# Patient Record
Sex: Female | Born: 1985 | Hispanic: No | Marital: Single | Smoking: Never smoker
Health system: Southern US, Community
[De-identification: ages and names within clinical notes are randomized; demographics above are authoritative.]

## PROBLEM LIST (undated history)

## (undated) DIAGNOSIS — Z8619 Personal history of other infectious and parasitic diseases: Secondary | ICD-10-CM

## (undated) DIAGNOSIS — I10 Essential (primary) hypertension: Secondary | ICD-10-CM

## (undated) DIAGNOSIS — F419 Anxiety disorder, unspecified: Secondary | ICD-10-CM

## (undated) DIAGNOSIS — R87629 Unspecified abnormal cytological findings in specimens from vagina: Secondary | ICD-10-CM

## (undated) DIAGNOSIS — J302 Other seasonal allergic rhinitis: Secondary | ICD-10-CM

## (undated) HISTORY — DX: Personal history of other infectious and parasitic diseases: Z86.19

## (undated) HISTORY — DX: Other seasonal allergic rhinitis: J30.2

## (undated) HISTORY — PX: FOOT SURGERY: SHX648

## (undated) HISTORY — DX: Unspecified abnormal cytological findings in specimens from vagina: R87.629

## (undated) HISTORY — DX: Anxiety disorder, unspecified: F41.9

## (undated) HISTORY — PX: COLPOSCOPY: SHX161

## (undated) HISTORY — PX: OTHER SURGICAL HISTORY: SHX169

---
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
## 2006-06-15 ENCOUNTER — Other Ambulatory Visit: Admission: RE | Admit: 2006-06-15 | Discharge: 2006-06-15 | Payer: Self-pay | Admitting: Obstetrics and Gynecology

## 2006-06-19 ENCOUNTER — Emergency Department (HOSPITAL_COMMUNITY): Admission: EM | Admit: 2006-06-19 | Discharge: 2006-06-19 | Payer: Self-pay | Admitting: Family Medicine

## 2006-12-02 ENCOUNTER — Emergency Department (HOSPITAL_COMMUNITY): Admission: EM | Admit: 2006-12-02 | Discharge: 2006-12-02 | Payer: Self-pay | Admitting: Emergency Medicine

## 2007-09-20 ENCOUNTER — Emergency Department (HOSPITAL_COMMUNITY): Admission: EM | Admit: 2007-09-20 | Discharge: 2007-09-20 | Payer: Self-pay | Admitting: Emergency Medicine

## 2010-02-21 ENCOUNTER — Inpatient Hospital Stay (HOSPITAL_COMMUNITY): Admission: AD | Admit: 2010-02-21 | Discharge: 2010-02-21 | Payer: Self-pay | Admitting: Obstetrics and Gynecology

## 2010-03-13 ENCOUNTER — Inpatient Hospital Stay (HOSPITAL_COMMUNITY): Admission: AD | Admit: 2010-03-13 | Discharge: 2010-03-13 | Payer: Self-pay | Admitting: Obstetrics and Gynecology

## 2010-03-18 ENCOUNTER — Inpatient Hospital Stay (HOSPITAL_COMMUNITY): Admission: AD | Admit: 2010-03-18 | Discharge: 2010-03-18 | Payer: Self-pay | Admitting: Obstetrics and Gynecology

## 2010-03-18 ENCOUNTER — Ambulatory Visit: Payer: Self-pay | Admitting: Obstetrics and Gynecology

## 2010-03-29 ENCOUNTER — Ambulatory Visit: Payer: Self-pay | Admitting: Gynecology

## 2010-03-29 ENCOUNTER — Inpatient Hospital Stay (HOSPITAL_COMMUNITY): Admission: AD | Admit: 2010-03-29 | Discharge: 2010-03-29 | Payer: Self-pay | Admitting: Obstetrics and Gynecology

## 2010-04-05 ENCOUNTER — Encounter (INDEPENDENT_AMBULATORY_CARE_PROVIDER_SITE_OTHER): Payer: Self-pay | Admitting: Obstetrics and Gynecology

## 2010-04-05 ENCOUNTER — Inpatient Hospital Stay (HOSPITAL_COMMUNITY): Admission: AD | Admit: 2010-04-05 | Discharge: 2010-04-07 | Payer: Self-pay | Admitting: Obstetrics and Gynecology

## 2010-05-09 ENCOUNTER — Emergency Department (HOSPITAL_COMMUNITY)
Admission: EM | Admit: 2010-05-09 | Discharge: 2010-05-09 | Payer: Self-pay | Source: Home / Self Care | Admitting: Emergency Medicine

## 2010-12-20 LAB — URINE MICROSCOPIC-ADD ON

## 2010-12-20 LAB — URINALYSIS, ROUTINE W REFLEX MICROSCOPIC
Glucose, UA: NEGATIVE mg/dL
Ketones, ur: NEGATIVE mg/dL
Nitrite: NEGATIVE
Protein, ur: NEGATIVE mg/dL
Urobilinogen, UA: 1 mg/dL (ref 0.0–1.0)
pH: 6.5 (ref 5.0–8.0)

## 2010-12-20 LAB — POCT I-STAT, CHEM 8
BUN: 8 mg/dL (ref 6–23)
Creatinine, Ser: 0.9 mg/dL (ref 0.4–1.2)
HCT: 41 % (ref 36.0–46.0)
Hemoglobin: 13.9 g/dL (ref 12.0–15.0)
Potassium: 3.7 mEq/L (ref 3.5–5.1)

## 2010-12-20 LAB — POCT PREGNANCY, URINE: Preg Test, Ur: NEGATIVE

## 2010-12-22 LAB — CBC
HCT: 28.8 % — ABNORMAL LOW (ref 36.0–46.0)
MCHC: 34.6 g/dL (ref 30.0–36.0)
Platelets: 224 10*3/uL (ref 150–400)
RDW: 15 % (ref 11.5–15.5)
RDW: 15.3 % (ref 11.5–15.5)
WBC: 10.3 10*3/uL (ref 4.0–10.5)

## 2010-12-22 LAB — URINALYSIS, ROUTINE W REFLEX MICROSCOPIC
Hgb urine dipstick: NEGATIVE
Ketones, ur: NEGATIVE mg/dL

## 2010-12-22 LAB — RPR: RPR Ser Ql: NONREACTIVE

## 2010-12-22 LAB — URINE MICROSCOPIC-ADD ON

## 2010-12-23 LAB — URINALYSIS, ROUTINE W REFLEX MICROSCOPIC
Glucose, UA: NEGATIVE mg/dL
Hgb urine dipstick: NEGATIVE
Ketones, ur: NEGATIVE mg/dL
Nitrite: NEGATIVE
Urobilinogen, UA: 0.2 mg/dL (ref 0.0–1.0)

## 2010-12-23 LAB — URINE CULTURE

## 2010-12-23 LAB — URINE MICROSCOPIC-ADD ON

## 2010-12-23 LAB — FETAL FIBRONECTIN: Fetal Fibronectin: NEGATIVE

## 2012-04-21 ENCOUNTER — Other Ambulatory Visit: Payer: Self-pay | Admitting: Family Medicine

## 2012-04-21 DIAGNOSIS — R1901 Right upper quadrant abdominal swelling, mass and lump: Secondary | ICD-10-CM

## 2012-04-22 ENCOUNTER — Ambulatory Visit
Admission: RE | Admit: 2012-04-22 | Discharge: 2012-04-22 | Disposition: A | Discharge status: Home / Self Care | Payer: PRIVATE HEALTH INSURANCE | Source: Ambulatory Visit | Attending: Family Medicine | Admitting: Family Medicine

## 2012-04-22 DIAGNOSIS — R1901 Right upper quadrant abdominal swelling, mass and lump: Secondary | ICD-10-CM

## 2012-04-22 MED ORDER — IOHEXOL 300 MG/ML  SOLN
125.0000 mL | Freq: Once | INTRAMUSCULAR | Status: AC | PRN
  Administered 2012-04-22: 125 mL via INTRAVENOUS

## 2012-07-14 ENCOUNTER — Encounter (INDEPENDENT_AMBULATORY_CARE_PROVIDER_SITE_OTHER): Payer: Self-pay | Admitting: General Surgery

## 2012-07-19 ENCOUNTER — Ambulatory Visit (INDEPENDENT_AMBULATORY_CARE_PROVIDER_SITE_OTHER): Payer: PRIVATE HEALTH INSURANCE | Admitting: Surgery

## 2013-04-25 IMAGING — CT CT ABDOMEN W/ CM
2 of 4 series · 17 of 46 positions shown, 19 images · IV contrast (READICAT/WATER & [ID] OMNI 300)
Comparison: [DATE] CT

CLINICAL DATA: 26-year-old female with right-sided abdominal pain,
nausea and mass.

CT ABDOMEN WITH CONTRAST
TECHNIQUE: Multidetector CT imaging of the abdomen was performed
following the standard protocol during bolus administration of
intravenous contrast.
Contrast: 125 ml intravenous Cmnipaque-EVV

[Series 2: abdomen w/ · axial · 0.82mm/px · z∈[-195,+65]mm · 14 of 53 slices shown, 16 images]
[im 3/53  soft-tissue]
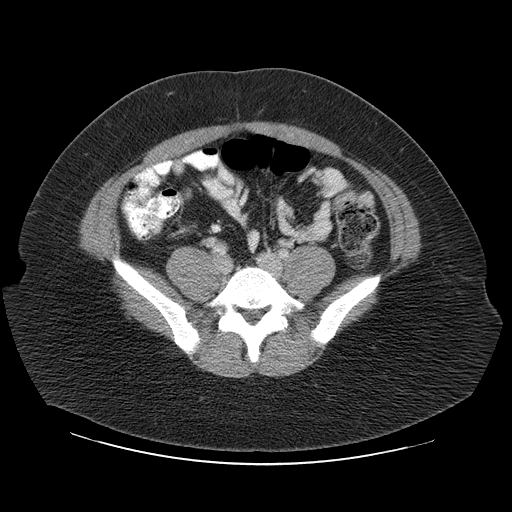
[im 3/53  bone]
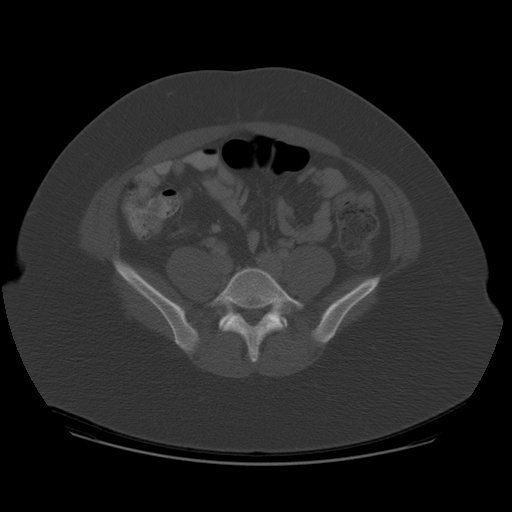
[im 8/53  soft-tissue]
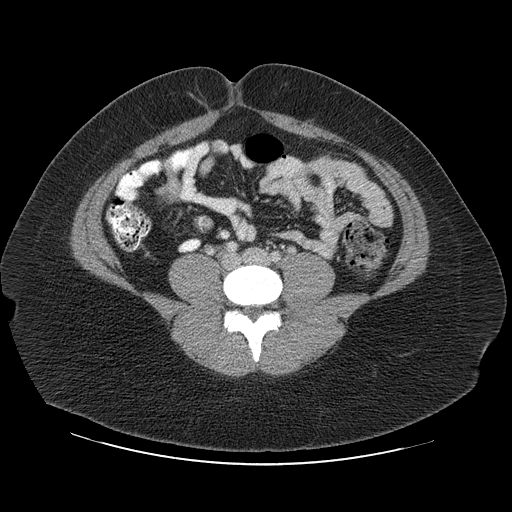
[im 11/53  soft-tissue]
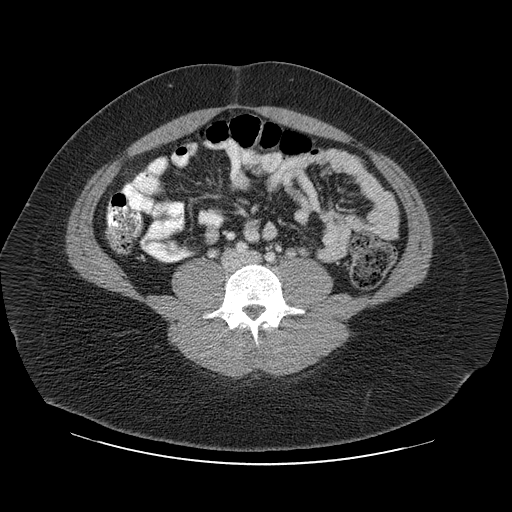
[im 14/53  soft-tissue]
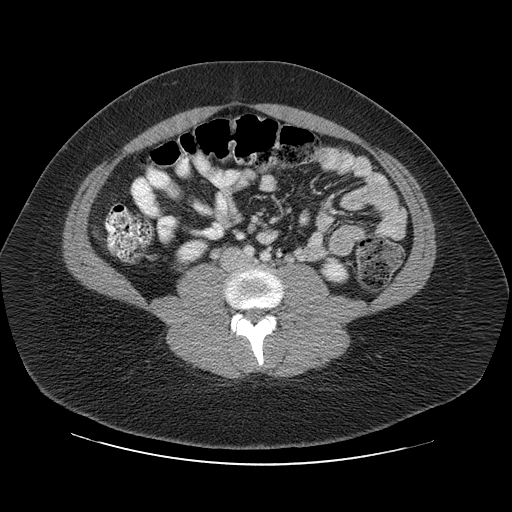
[im 19/53  soft-tissue]
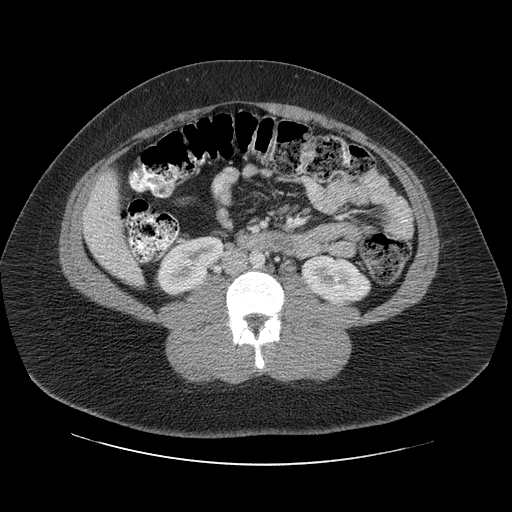
[im 21/53  soft-tissue]
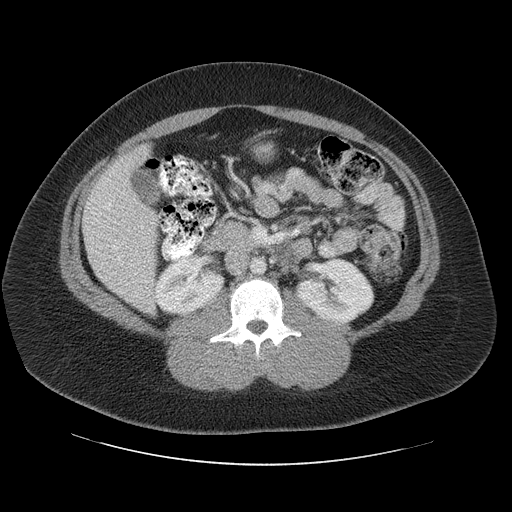
[im 24/53  soft-tissue]
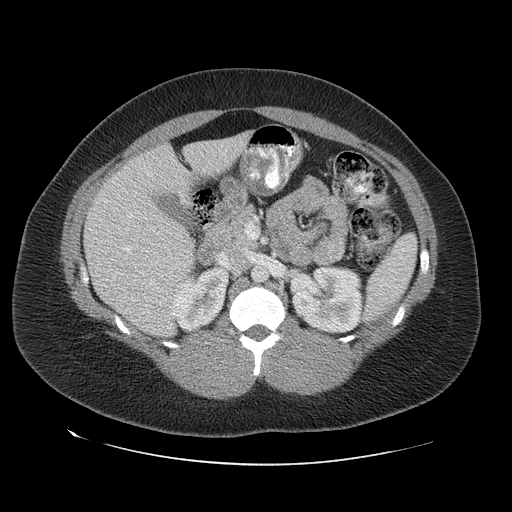
[im 29/53  soft-tissue]
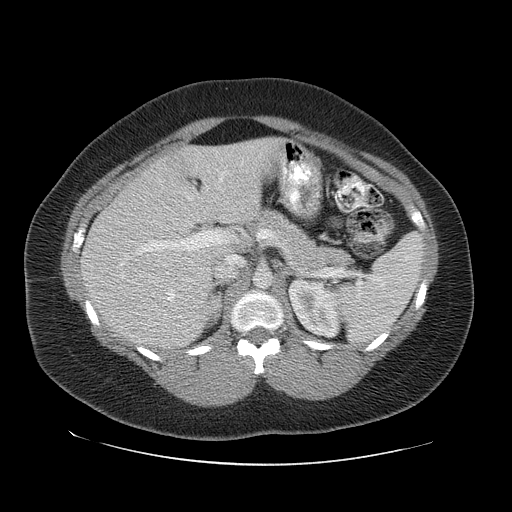
[im 32/53  soft-tissue]
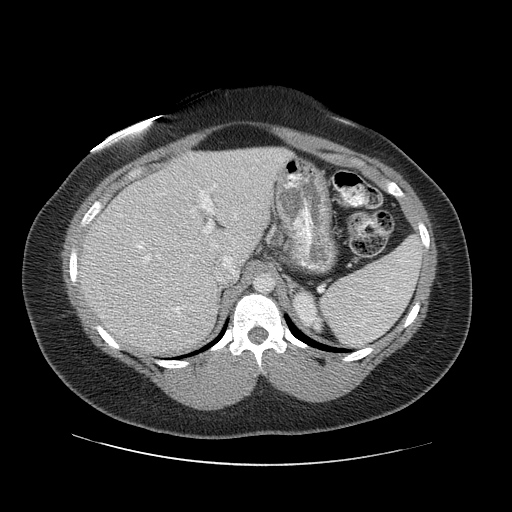
[im 32/53  bone]
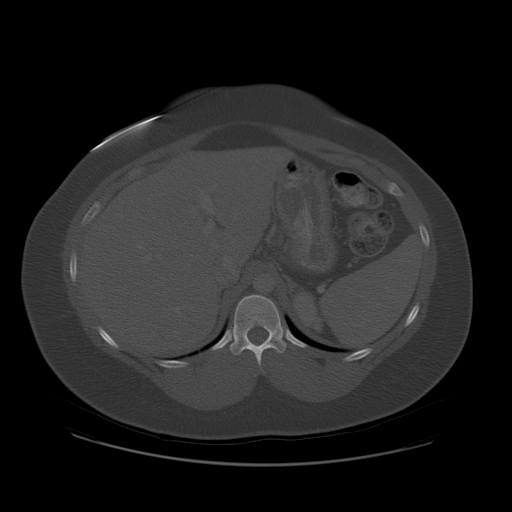
[im 34/53  soft-tissue]
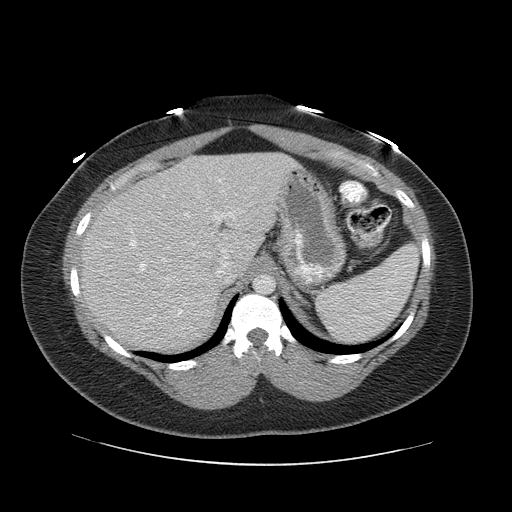
[im 40/53  soft-tissue]
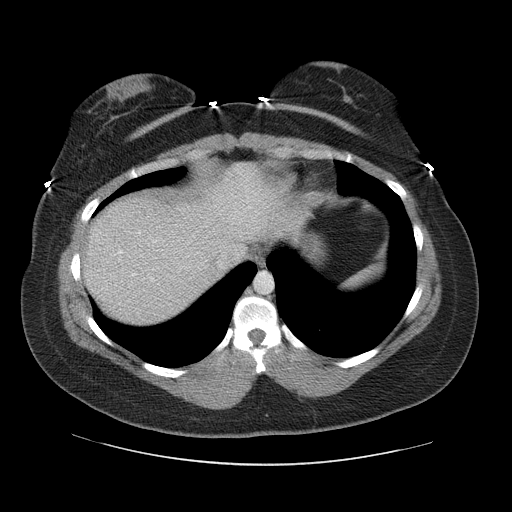
[im 42/53  soft-tissue]
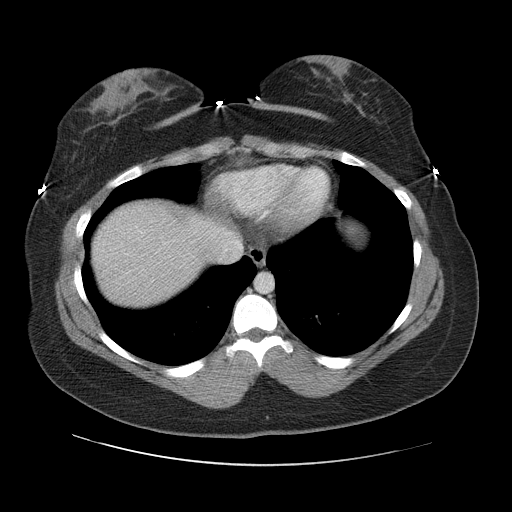
[im 45/53  soft-tissue]
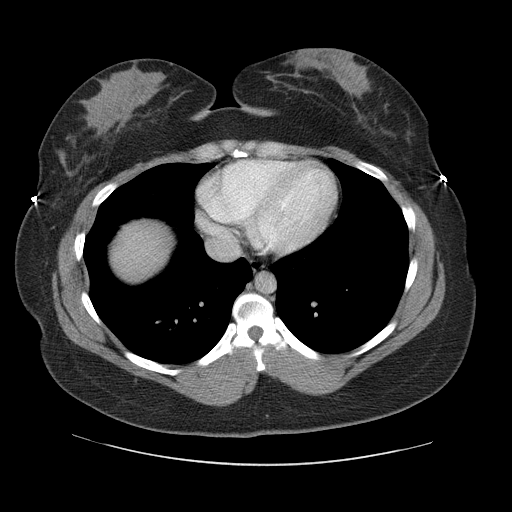
[im 50/53  soft-tissue]
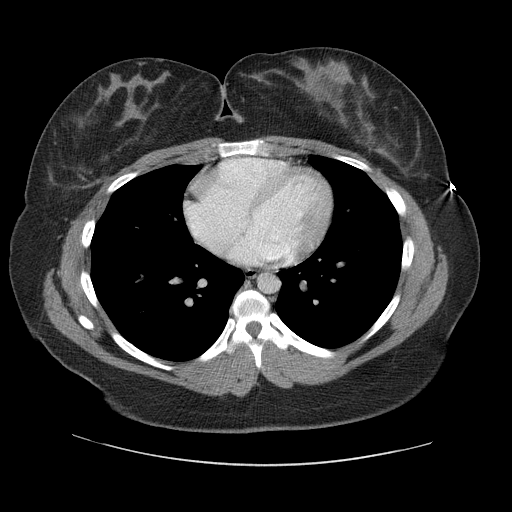

[Series 400: cor · coronal · 0.82mm/px · 3 of 137 slices shown]
[im 46/137  soft-tissue]
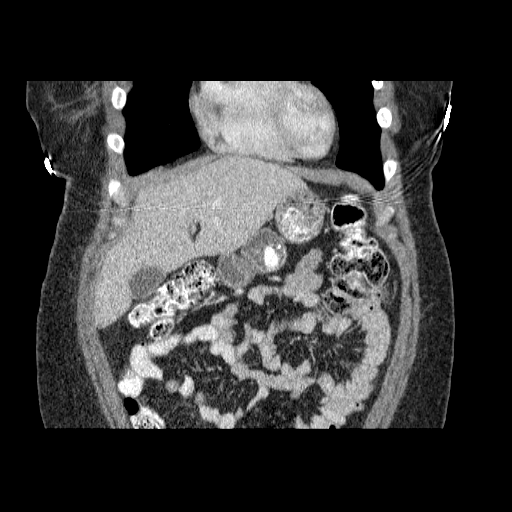
[im 61/137  soft-tissue]
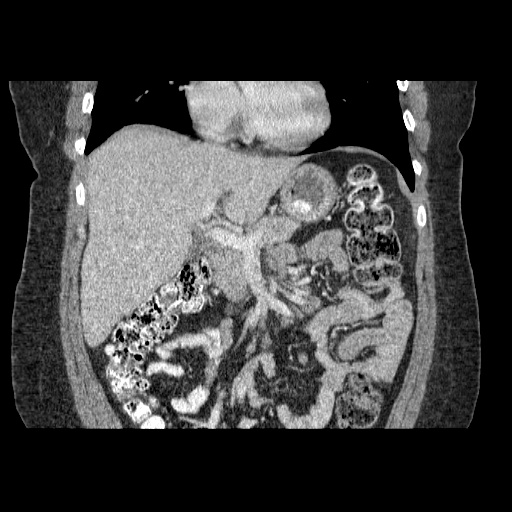
[im 76/137  soft-tissue]
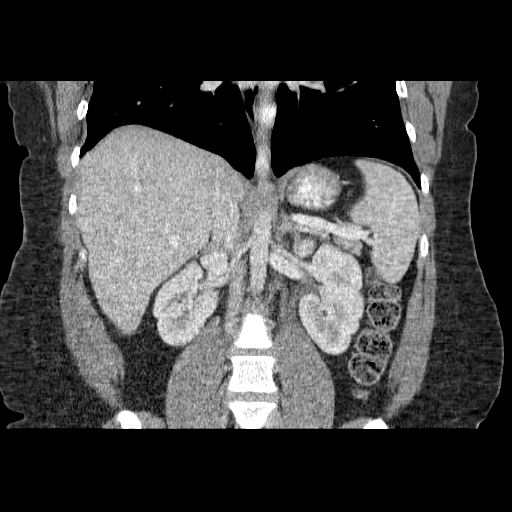

[17 of 46 positions shown; findings below may reference images not displayed]

FINDINGS: The lung bases are clear.

The liver, gallbladder, spleen, adrenal glands, kidneys and
pancreas are unremarkable.

No free fluid, enlarged lymph nodes, biliary dilation or abdominal
aortic aneurysm identified.

The visualized bowel is unremarkable.

There is no evidence of mass or ventral hernia.

No acute or suspicious bony abnormalities are identified.
IMPRESSION: Unremarkable abdomen CT with contrast.

## 2015-10-07 NOTE — L&D Delivery Note (Signed)
Delivery Note At 3:35 PM a viable female was delivered via NSVD (Presentation: ROA  ).  APGAR:8 ,9 ; weight pending .   Placenta status: delivered spontaneously .  Cord:  with the following complications: none .    Anesthesia:  epidural Episiotomy: none  Lacerations:  Small second degree Suture Repair: 3.0 vicryl rapide Est. Blood Loss (mL):  225ml  Mom to postpartum.  Baby to Couplet care / Skin to Skin.  [name],Julie [name] W 05/21/2016, 3:58 PM

## 2015-10-24 LAB — OB RESULTS CONSOLE ABO/RH: RH Type: POSITIVE

## 2015-10-24 LAB — OB RESULTS CONSOLE HEPATITIS B SURFACE ANTIGEN: HEP B S AG: NEGATIVE

## 2015-10-24 LAB — OB RESULTS CONSOLE ANTIBODY SCREEN: Antibody Screen: NEGATIVE

## 2015-10-24 LAB — OB RESULTS CONSOLE HIV ANTIBODY (ROUTINE TESTING): HIV: NONREACTIVE

## 2015-10-24 LAB — OB RESULTS CONSOLE RUBELLA ANTIBODY, IGM: RUBELLA: IMMUNE

## 2015-10-24 LAB — OB RESULTS CONSOLE RPR: RPR: NONREACTIVE

## 2015-10-24 LAB — OB RESULTS CONSOLE GC/CHLAMYDIA
CHLAMYDIA, DNA PROBE: NEGATIVE
Gonorrhea: NEGATIVE

## 2016-04-23 LAB — OB RESULTS CONSOLE GBS: GBS: POSITIVE

## 2016-05-16 ENCOUNTER — Encounter (HOSPITAL_COMMUNITY): Payer: Self-pay | Admitting: *Deleted

## 2016-05-16 ENCOUNTER — Telehealth (HOSPITAL_COMMUNITY): Payer: Self-pay | Admitting: *Deleted

## 2016-05-16 NOTE — Telephone Encounter (Signed)
Preadmission screen  

## 2016-05-20 NOTE — H&P (Signed)
Julie [name] is a 30 y.o. female G2P1001 at 8039 6/7 weeks (EDD 05/22/16 by LMP c/w 6 week US) presenting for IOL at term with favorable cervix.  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  OB History    Gravida Para Term Preterm AB Living   2 1 1    0 1   SAB TAB Ectopic Multiple Live Births   0       1    2010 NSVD 7#15oz  Past Medical History:  Diagnosis Date  . Anxiety   . Hx of chlamydia infection   . Hx of varicella   . Seasonal allergies   . Vaginal Pap smear, abnormal    Past Surgical History:  Procedure Laterality Date  . cleft lip surgery    . COLPOSCOPY    . FOOT SURGERY     Family History: family history includes Diabetes in her father and maternal grandmother; Hypertension in her father and maternal grandmother. Social History:  reports that she has never smoked. She has never used smokeless tobacco. She reports that she drinks alcohol. She reports that she does not use drugs.     Maternal Diabetes: No Genetic Screening: Normal Maternal Ultrasounds/Referrals: Normal Fetal Ultrasounds or other Referrals:  None Maternal Substance Abuse:  No Significant Maternal Medications:  None Significant Maternal Lab Results:  Lab values include: Group B Strep positive Other Comments:  None  Review of Systems  Constitutional: Negative for fever.   Maternal Medical History:  Contractions: Frequency: irregular.   Perceived severity is mild.    Fetal activity: Perceived fetal activity is normal.   Last perceived fetal movement was within the past hour.    Prenatal Complications - Diabetes: none.      Last menstrual period 08/16/2015. Maternal Exam:  Uterine Assessment: Contraction strength is mild.  Contraction frequency is irregular.   Abdomen: Patient reports no abdominal tenderness. Fetal presentation: vertex  Introitus: Normal vulva. Normal vagina.    Physical Exam  Constitutional: She appears well-developed and well-nourished.   Cardiovascular: Normal rate and regular rhythm.   Respiratory: Effort normal.  GI: Soft.  Genitourinary: Vagina normal.  Neurological: She is alert.  Psychiatric: She has a normal mood and affect.    Prenatal labs: ABO, Rh: B/Positive/-- (01/18 0000) Antibody: Negative (01/18 0000) Rubella: Immune (01/18 0000) RPR: Nonreactive (01/18 0000)  HBsAg: Negative (01/18 0000)  HIV: Non-reactive (01/18 0000)  GBS: Positive (07/19 0000)  First trimester screen and AFP negative Hgb electrophoresis Beta thal carrier One hour GCT 153 Three hour GTT 88/146/134/77 CF neg   Assessment/Plan: Pt for IOL at term.  Plan PCN for +GBS then Pitocin and AROM.    Oliver PilaICHARDSON,Ivis Henneman W 05/20/2016, 10:14 PM

## 2016-05-21 ENCOUNTER — Inpatient Hospital Stay (HOSPITAL_COMMUNITY): Payer: 59 | Admitting: Anesthesiology

## 2016-05-21 ENCOUNTER — Inpatient Hospital Stay (HOSPITAL_COMMUNITY)
Admission: RE | Admit: 2016-05-21 | Discharge: 2016-05-23 | DRG: 775 | Disposition: A | Discharge status: Home / Self Care | Payer: 59 | Source: Ambulatory Visit | Attending: Obstetrics and Gynecology | Admitting: Obstetrics and Gynecology

## 2016-05-21 ENCOUNTER — Encounter (HOSPITAL_COMMUNITY): Payer: Self-pay

## 2016-05-21 DIAGNOSIS — Z833 Family history of diabetes mellitus: Secondary | ICD-10-CM

## 2016-05-21 DIAGNOSIS — Z3A39 39 weeks gestation of pregnancy: Secondary | ICD-10-CM

## 2016-05-21 DIAGNOSIS — Z8249 Family history of ischemic heart disease and other diseases of the circulatory system: Secondary | ICD-10-CM

## 2016-05-21 DIAGNOSIS — O99824 Streptococcus B carrier state complicating childbirth: Secondary | ICD-10-CM | POA: Diagnosis present

## 2016-05-21 DIAGNOSIS — Z3490 Encounter for supervision of normal pregnancy, unspecified, unspecified trimester: Secondary | ICD-10-CM

## 2016-05-21 DIAGNOSIS — Z3483 Encounter for supervision of other normal pregnancy, third trimester: Secondary | ICD-10-CM | POA: Diagnosis present

## 2016-05-21 LAB — RPR: RPR: NONREACTIVE

## 2016-05-21 LAB — CBC
HCT: 33.2 % — ABNORMAL LOW (ref 36.0–46.0)
Hemoglobin: 11.4 g/dL — ABNORMAL LOW (ref 12.0–15.0)
MCH: 24.8 pg — AB (ref 26.0–34.0)
MCHC: 34.3 g/dL (ref 30.0–36.0)
MCV: 72.3 fL — ABNORMAL LOW (ref 78.0–100.0)
Platelets: 200 10*3/uL (ref 150–400)
RBC: 4.59 MIL/uL (ref 3.87–5.11)
RDW: 15.3 % (ref 11.5–15.5)
WBC: 5.9 10*3/uL (ref 4.0–10.5)

## 2016-05-21 LAB — ABO/RH: ABO/RH(D): B POS

## 2016-05-21 LAB — TYPE AND SCREEN
ABO/RH(D): B POS
Antibody Screen: NEGATIVE

## 2016-05-21 MED ORDER — DIBUCAINE 1 % RE OINT: 1.0000 "application " | TOPICAL_OINTMENT | RECTAL | Status: DC | PRN

## 2016-05-21 MED ORDER — SOD CITRATE-CITRIC ACID 500-334 MG/5ML PO SOLN: 30.0000 mL | ORAL | Status: DC | PRN

## 2016-05-21 MED ORDER — PRENATAL MULTIVITAMIN CH
1.0000 | ORAL_TABLET | Freq: Every day | ORAL | Status: DC
  Administered 2016-05-22: 1 via ORAL
  Filled 2016-05-21: qty 1

## 2016-05-21 MED ORDER — OXYTOCIN 40 UNITS IN LACTATED RINGERS INFUSION - SIMPLE MED
1.0000 m[IU]/min | INTRAVENOUS | Status: DC
  Administered 2016-05-21: 2 m[IU]/min via INTRAVENOUS
  Filled 2016-05-21: qty 1000

## 2016-05-21 MED ORDER — PHENYLEPHRINE 40 MCG/ML (10ML) SYRINGE FOR IV PUSH (FOR BLOOD PRESSURE SUPPORT)
80.0000 ug | PREFILLED_SYRINGE | INTRAVENOUS | Status: DC | PRN
Start: 2016-05-21 — End: 2016-05-21
  Filled 2016-05-21: qty 5

## 2016-05-21 MED ORDER — DEXTROSE 5 % IV SOLN
5.0000 10*6.[IU] | Freq: Once | INTRAVENOUS | Status: AC
  Administered 2016-05-21: 5 10*6.[IU] via INTRAVENOUS
  Filled 2016-05-21: qty 5

## 2016-05-21 MED ORDER — IBUPROFEN 600 MG PO TABS
600.0000 mg | ORAL_TABLET | Freq: Four times a day (QID) | ORAL | Status: DC
  Administered 2016-05-21 – 2016-05-23 (×8): 600 mg via ORAL
  Filled 2016-05-21 (×8): qty 1

## 2016-05-21 MED ORDER — FENTANYL 2.5 MCG/ML BUPIVACAINE 1/10 % EPIDURAL INFUSION (WH - ANES)
14.0000 mL/h | INTRAMUSCULAR | Status: DC | PRN
  Administered 2016-05-21: 14 mL/h via EPIDURAL
  Filled 2016-05-21: qty 125

## 2016-05-21 MED ORDER — OXYTOCIN BOLUS FROM INFUSION
500.0000 mL | Freq: Once | INTRAVENOUS | Status: AC
  Administered 2016-05-21: 999 mL/h via INTRAVENOUS

## 2016-05-21 MED ORDER — COCONUT OIL OIL: 1.0000 "application " | TOPICAL_OIL | Status: DC | PRN

## 2016-05-21 MED ORDER — DIPHENHYDRAMINE HCL 25 MG PO CAPS: 25.0000 mg | ORAL_CAPSULE | Freq: Four times a day (QID) | ORAL | Status: DC | PRN

## 2016-05-21 MED ORDER — EPHEDRINE 5 MG/ML INJ
10.0000 mg | INTRAVENOUS | Status: DC | PRN
Start: 2016-05-21 — End: 2016-05-21
  Filled 2016-05-21: qty 4

## 2016-05-21 MED ORDER — OXYCODONE HCL 5 MG PO TABS: 5.0000 mg | ORAL_TABLET | ORAL | Status: DC | PRN

## 2016-05-21 MED ORDER — ONDANSETRON HCL 4 MG PO TABS: 4.0000 mg | ORAL_TABLET | ORAL | Status: DC | PRN

## 2016-05-21 MED ORDER — ONDANSETRON HCL 4 MG/2ML IJ SOLN: 4.0000 mg | INTRAMUSCULAR | Status: DC | PRN

## 2016-05-21 MED ORDER — BENZOCAINE-MENTHOL 20-0.5 % EX AERO
1.0000 "application " | INHALATION_SPRAY | CUTANEOUS | Status: DC | PRN
  Filled 2016-05-21: qty 56

## 2016-05-21 MED ORDER — DIPHENHYDRAMINE HCL 50 MG/ML IJ SOLN: 12.5000 mg | INTRAMUSCULAR | Status: DC | PRN

## 2016-05-21 MED ORDER — ZOLPIDEM TARTRATE 5 MG PO TABS: 5.0000 mg | ORAL_TABLET | Freq: Every evening | ORAL | Status: DC | PRN

## 2016-05-21 MED ORDER — OXYCODONE-ACETAMINOPHEN 5-325 MG PO TABS: 1.0000 | ORAL_TABLET | ORAL | Status: DC | PRN

## 2016-05-21 MED ORDER — OXYCODONE-ACETAMINOPHEN 5-325 MG PO TABS: 2.0000 | ORAL_TABLET | ORAL | Status: DC | PRN

## 2016-05-21 MED ORDER — SIMETHICONE 80 MG PO CHEW: 80.0000 mg | CHEWABLE_TABLET | ORAL | Status: DC | PRN

## 2016-05-21 MED ORDER — LIDOCAINE HCL (PF) 1 % IJ SOLN
INTRAMUSCULAR | Status: DC | PRN
  Administered 2016-05-21: 7 mL
  Administered 2016-05-21: 5 mL

## 2016-05-21 MED ORDER — EPHEDRINE 5 MG/ML INJ
10.0000 mg | INTRAVENOUS | Status: DC | PRN
  Filled 2016-05-21: qty 4

## 2016-05-21 MED ORDER — LACTATED RINGERS IV SOLN
500.0000 mL | Freq: Once | INTRAVENOUS | Status: AC
  Administered 2016-05-21: 500 mL via INTRAVENOUS

## 2016-05-21 MED ORDER — TETANUS-DIPHTH-ACELL PERTUSSIS 5-2.5-18.5 LF-MCG/0.5 IM SUSP: 0.5000 mL | Freq: Once | INTRAMUSCULAR | Status: DC

## 2016-05-21 MED ORDER — FENTANYL CITRATE (PF) 100 MCG/2ML IJ SOLN: 50.0000 ug | INTRAMUSCULAR | Status: DC | PRN

## 2016-05-21 MED ORDER — TERBUTALINE SULFATE 1 MG/ML IJ SOLN
0.2500 mg | Freq: Once | INTRAMUSCULAR | Status: DC | PRN
  Filled 2016-05-21: qty 1

## 2016-05-21 MED ORDER — SENNOSIDES-DOCUSATE SODIUM 8.6-50 MG PO TABS
2.0000 | ORAL_TABLET | ORAL | Status: DC
  Administered 2016-05-21 – 2016-05-22 (×2): 2 via ORAL
  Filled 2016-05-21 (×2): qty 2

## 2016-05-21 MED ORDER — OXYCODONE HCL 5 MG PO TABS: 10.0000 mg | ORAL_TABLET | ORAL | Status: DC | PRN

## 2016-05-21 MED ORDER — LACTATED RINGERS IV SOLN: 500.0000 mL | INTRAVENOUS | Status: DC | PRN

## 2016-05-21 MED ORDER — LACTATED RINGERS IV SOLN
INTRAVENOUS | Status: DC
  Administered 2016-05-21 (×2): via INTRAVENOUS

## 2016-05-21 MED ORDER — LIDOCAINE HCL (PF) 1 % IJ SOLN
30.0000 mL | INTRAMUSCULAR | Status: DC | PRN
  Filled 2016-05-21: qty 30

## 2016-05-21 MED ORDER — WITCH HAZEL-GLYCERIN EX PADS: 1.0000 "application " | MEDICATED_PAD | CUTANEOUS | Status: DC | PRN

## 2016-05-21 MED ORDER — ONDANSETRON HCL 4 MG/2ML IJ SOLN: 4.0000 mg | Freq: Four times a day (QID) | INTRAMUSCULAR | Status: DC | PRN

## 2016-05-21 MED ORDER — DEXTROSE 5 % IV SOLN
2.5000 10*6.[IU] | INTRAVENOUS | Status: DC
  Administered 2016-05-21: 2.5 10*6.[IU] via INTRAVENOUS
  Filled 2016-05-21 (×4): qty 2.5

## 2016-05-21 MED ORDER — ACETAMINOPHEN 325 MG PO TABS
650.0000 mg | ORAL_TABLET | ORAL | Status: DC | PRN
  Administered 2016-05-21 – 2016-05-23 (×3): 650 mg via ORAL
  Filled 2016-05-21 (×3): qty 2

## 2016-05-21 MED ORDER — ACETAMINOPHEN 325 MG PO TABS: 650.0000 mg | ORAL_TABLET | ORAL | Status: DC | PRN

## 2016-05-21 MED ORDER — PHENYLEPHRINE 40 MCG/ML (10ML) SYRINGE FOR IV PUSH (FOR BLOOD PRESSURE SUPPORT)
80.0000 ug | PREFILLED_SYRINGE | INTRAVENOUS | Status: DC | PRN
  Filled 2016-05-21: qty 5
  Filled 2016-05-21: qty 10

## 2016-05-21 MED ORDER — OXYTOCIN 40 UNITS IN LACTATED RINGERS INFUSION - SIMPLE MED: 2.5000 [IU]/h | INTRAVENOUS | Status: DC

## 2016-05-21 NOTE — Anesthesia Preprocedure Evaluation (Addendum)
Anesthesia Evaluation  Patient identified by MRN, date of birth, ID band Patient awake    Reviewed: Allergy & Precautions, NPO status , Patient's Chart, lab work & pertinent test results  History of Anesthesia Complications Negative for: history of anesthetic complications  Airway Mallampati: II       Dental  (+) Teeth Intact, Dental Advisory Given   Pulmonary neg pulmonary ROS,    Pulmonary exam normal breath sounds clear to auscultation       Cardiovascular negative cardio ROS   Rhythm:Regular Rate:Normal     Neuro/Psych PSYCHIATRIC DISORDERS Anxiety negative neurological ROS     GI/Hepatic negative GI ROS, Neg liver ROS,   Endo/Other  negative endocrine ROS  Renal/GU negative Renal ROS     Musculoskeletal   Abdominal (+) + obese,   Peds  Hematology negative hematology ROS (+)   Anesthesia Other Findings Hgb 11.4 Platelets 200  Reproductive/Obstetrics (+) Pregnancy                           Anesthesia Physical Anesthesia Plan  ASA: II  Anesthesia Plan: Epidural   Post-op Pain Management:    Induction:   Airway Management Planned: Natural Airway  Additional Equipment:   Intra-op Plan:   Post-operative Plan:   Informed Consent: I have reviewed the patients History and Physical, chart, labs and discussed the procedure including the risks, benefits and alternatives for the proposed anesthesia with the patient or authorized representative who has indicated his/her understanding and acceptance.   Dental advisory given  Plan Discussed with:   Anesthesia Plan Comments:         Anesthesia Quick Evaluation

## 2016-05-21 NOTE — Progress Notes (Signed)
Patient ID: Julie [name], female   DOB: 10/14/1985, 30 y.o.   MRN: [id number] Pt admitted and beginning pitocin, mild contractions  afeb vss FHR category 1  Cervix 50/2-3/-2   Will plan AROM when PCN on board for +GBS

## 2016-05-21 NOTE — Anesthesia Procedure Notes (Signed)
Epidural Patient location during procedure: OB Start time: 05/21/2016 11:10 AM End time: 05/21/2016 11:12 AM  Staffing Anesthesiologist: Linton RumpALLAN, Jamilett Ferrante DICKERSON Performed: anesthesiologist   Preanesthetic Checklist Completed: patient identified, site marked, surgical consent, pre-op evaluation, timeout performed, IV checked, risks and benefits discussed and monitors and equipment checked  Epidural Patient position: sitting Prep: site prepped and draped and DuraPrep Patient monitoring: continuous pulse ox and blood pressure Approach: midline Location: L3-L4 Injection technique: LOR air  Needle:  Needle type: Tuohy  Needle gauge: 17 G Needle length: 9 cm and 9 Needle insertion depth: 9 cm Catheter type: closed end flexible Catheter size: 19 Gauge Catheter at skin depth: 14 cm Test dose: negative  Assessment Events: blood not aspirated, injection not painful, no injection resistance, negative IV test and no paresthesia

## 2016-05-21 NOTE — Anesthesia Pain Management Evaluation Note (Signed)
  CRNA Pain Management Visit Note  Patient: Julie [name], 30 y.o., female  "Hello I am a member of the anesthesia team at [facility]. We have an anesthesia team available at all times to provide care throughout the hospital, including epidural management and anesthesia for C-section. I don't know your plan for the delivery whether it a natural birth, water birth, IV sedation, nitrous supplementation, doula or epidural, but we want to meet your pain goals."   1.Was your pain managed to your expectations on prior hospitalizations?   No   2.What is your expectation for pain management during this hospitalization?     Epidural  3.How can we help you reach that goal? epidural  Record the patient's initial score and the patient's pain goal.   Pain: 2  Pain Goal: 5 [facility] wants you to be able to say your pain was always managed very well.  Lilee Aldea 05/21/2016

## 2016-05-21 NOTE — Progress Notes (Signed)
Patient ID: Julie [name], female   DOB: 02/11/1986, 30 y.o.   MRN: [id number] Pt comfortable with epidural   afeb vss  Cervix 75/5/-1  IUPC placed   Pt placed on peanut and in sims position

## 2016-05-21 NOTE — Progress Notes (Signed)
Delivery of live viable female by Dr Senaida Oresichardson. APGARS 9,9

## 2016-05-22 LAB — CBC
HEMATOCRIT: 29.5 % — AB (ref 36.0–46.0)
HEMOGLOBIN: 10 g/dL — AB (ref 12.0–15.0)
MCH: 24.6 pg — AB (ref 26.0–34.0)
MCHC: 33.9 g/dL (ref 30.0–36.0)
MCV: 72.7 fL — ABNORMAL LOW (ref 78.0–100.0)
Platelets: 154 10*3/uL (ref 150–400)
RBC: 4.06 MIL/uL (ref 3.87–5.11)
RDW: 15.3 % (ref 11.5–15.5)
WBC: 8.5 10*3/uL (ref 4.0–10.5)

## 2016-05-22 NOTE — Lactation Note (Signed)
This note was copied from a baby's chart. Lactation Consultation Note Experienced BF mom for 1 month to her now 89 yr old mom. States she knows a lot of things has changed. Encouraged STS. Mom has pendulum breast w/everted nipple at the end of breast w/nipple turning inwards towards body. Mom attempting to BF, baby to sleepy. Mom needed pillows for positioning for comfort. Hand expression taught w/colostrum noted. Mom encouraged to feed baby 8-12 times/24 hours and with feeding cues. Referred to Baby and Me Book in Breastfeeding section Pg. 22-23 for position options and Proper latch demonstration. Educated about newborn behavior, I&O, supply and demand. WH/LC brochure given w/resources, support groups and LC services. Patient Name: Girl Neill Loftraci Gannett WUJWJ'XToday's Date: 05/22/2016 Reason for consult: Initial assessment   Maternal Data Has patient been taught Hand Expression?: Yes Does the patient have breastfeeding experience prior to this delivery?: Yes  Feeding Feeding Type: Breast Fed Length of feed: 0 min  LATCH Score/Interventions Latch: Too sleepy or reluctant, no latch achieved, no sucking elicited. Intervention(s): Skin to skin;Teach feeding cues;Waking techniques  Audible Swallowing: None Intervention(s): Alternate breast massage;Hand expression;Skin to skin  Type of Nipple: Everted at rest and after stimulation  Comfort (Breast/Nipple): Soft / non-tender     Hold (Positioning): Assistance needed to correctly position infant at breast and maintain latch. Intervention(s): Skin to skin;Position options;Support Pillows;Breastfeeding basics reviewed  LATCH Score: 5  Lactation Tools Discussed/Used WIC Program: Yes   Consult Status Consult Status: Follow-up Date: 05/23/16 Follow-up type: In-patient    Josilynn Losh, Diamond NickelLAURA G 05/22/2016, 7:00 AM

## 2016-05-22 NOTE — Lactation Note (Signed)
This note was copied from a baby's chart. Lactation Consultation Note  Baby is 25 hours and is not interested in eating much  today.  Suspect she will wake to eat this evening.  SHowed mom how to hand express and spoon feed [name]. She ate 1 ml and then fell back to sleep again. Encouraged mother to offer [name] small amounts anytime mom desired to help get a few calories into [name]. Mother is easily able to hand express colostrum.  Follow-up tomorrow.  Patient Name: Julie [name] OZHYQ'MToday's Date: 05/22/2016     Maternal Data    Feeding Feeding Type: Breast Milk Length of feed: 0 min  LATCH Score/Interventions Latch: Too sleepy or reluctant, no latch achieved, no sucking elicited.  Audible Swallowing: None  Type of Nipple: Inverted (with pinch test)  Comfort (Breast/Nipple): Soft / non-tender     Hold (Positioning): No assistance needed to correctly position infant at breast.  LATCH Score: 4  Lactation Tools Discussed/Used     Consult Status Consult Status: Follow-up Date: 05/23/16 Follow-up type: In-patient    Soyla DryerJoseph, Olina Melfi 05/22/2016, 5:22 PM

## 2016-05-22 NOTE — Anesthesia Postprocedure Evaluation (Signed)
Anesthesia Post Note  Patient: Melanee Cogle  Procedure(s) Performed: * No procedures listed *  Patient location during evaluation: Mother Baby Anesthesia Type: Epidural Level of consciousness: awake, awake and alert, oriented and patient cooperative Pain management: pain level controlled Vital Signs Assessment: post-procedure vital signs reviewed and stable Respiratory status: spontaneous breathing, nonlabored ventilation and respiratory function stable Cardiovascular status: stable Postop Assessment: no headache, no backache, no signs of nausea or vomiting and patient able to bend at knees Anesthetic complications: no     Last Vitals:  Vitals:   05/21/16 2235 05/22/16 0636  BP: 118/64 119/71  Pulse: 91 86  Resp: 18 18  Temp: 36.7 C 36.7 C    Last Pain:  Vitals:   05/22/16 0731  TempSrc:   PainSc: 0-No pain   Pain Goal:                 Micco Bourbeau L

## 2016-05-22 NOTE — Progress Notes (Signed)
Post Partum Day 1 Subjective: no complaints, up ad lib, voiding, tolerating PO and nl lochia, pain controlled  Objective: Blood pressure 119/71, pulse 86, temperature 98.1 F (36.7 C), temperature source Oral, resp. rate 18, height 5\' 5"  (1.651 m), weight 111.6 kg (246 lb), last menstrual period 08/16/2015, SpO2 100 %, unknown if currently breastfeeding.  Physical Exam:  General: alert and no distress Lochia: appropriate Uterine Fundus: firm    Recent Labs  05/21/16 0805 05/22/16 0549  HGB 11.4* 10.0*  HCT 33.2* 29.5*    Assessment/Plan: Plan for discharge tomorrow, Breastfeeding and Lactation consult.  Routine care.     LOS: 1 day   Julie [name] 05/22/2016, 9:31 AM

## 2016-05-23 MED ORDER — IBUPROFEN 600 MG PO TABS: 600.0000 mg | ORAL_TABLET | Freq: Four times a day (QID) | ORAL | 0 refills | Status: DC

## 2016-05-23 NOTE — Discharge Summary (Signed)
OB Discharge Summary     Patient Name: Julie [name] DOB: 02/22/1986 MRN: [id number]  Date of admission: 05/21/2016 Delivering MD: Huel CoteICHARDSON, KATHY   Date of discharge: 05/23/2016  Admitting diagnosis: INDUCTION Intrauterine pregnancy: 332w6d     Secondary diagnosis:  Active Problems:   Term pregnancy, repeat   NSVD (normal spontaneous vaginal delivery)      Discharge diagnosis: Term Pregnancy Delivered                                                                                                Hospital course:  Induction of Labor With Vaginal Delivery   30 y.o. yo G2P2002 at 1092w6d was admitted to the hospital 05/21/2016 for induction of labor.  Indication for induction: Favorable cervix at term.  Patient had an uncomplicated labor course as follows: Membrane Rupture Time/Date: 9:30 AM ,05/21/2016   Intrapartum Procedures: Episiotomy: None [1]                                         Lacerations:  1st degree [2]  Patient had delivery of a Viable infant.  Information for the patient's newborn:  Julie [name], Julie [name] [[id number]][[id number]]  Delivery Method: Vaginal, Spontaneous Delivery (Filed from Delivery Summary)   05/21/2016  Details of delivery can be found in separate delivery note.  Patient had a routine postpartum course. Patient is discharged home 05/23/16.   Physical exam Vitals:   05/21/16 2235 05/22/16 0636 05/22/16 1802 05/23/16 0632  BP: 118/64 119/71 125/81 109/62  Pulse: 91 86 90 77  Resp: 18 18 18 18   Temp: 98 F (36.7 C) 98.1 F (36.7 C) 97.7 F (36.5 C) 97.9 F (36.6 C)  TempSrc: Oral Oral Oral Oral  SpO2: 99% 100%    Weight:      Height:       General: alert Lochia: appropriate Uterine Fundus: firm  Labs: Lab Results  Component Value Date   WBC 8.5 05/22/2016   HGB 10.0 (L) 05/22/2016   HCT 29.5 (L) 05/22/2016   MCV 72.7 (L) 05/22/2016   PLT 154 05/22/2016   CMP Latest Ref Rng & Units 05/09/2010  Glucose 70 - 99 mg/dL 470(J108(H)  BUN 6 - 23 mg/dL 8   Creatinine 0.4 - 1.2 mg/dL 0.9  Sodium 628135 - 366145 mEq/L 136  Potassium 3.5 - 5.1 mEq/L 3.7  Chloride 96 - 112 mEq/L 104    Discharge instruction: per After Visit Summary and "Baby and Me Booklet".  After visit meds:    Medication List    TAKE these medications   acetaminophen 500 MG tablet Commonly known as:  TYLENOL Take 500 mg by mouth every 6 (six) hours as needed for moderate pain.   calcium carbonate 500 MG chewable tablet Commonly known as:  TUMS - dosed in mg elemental calcium Chew 2 tablets by mouth daily as needed for indigestion or heartburn.   ibuprofen 600 MG tablet Commonly known as:  ADVIL,MOTRIN Take 1 tablet (600 mg total) by mouth every 6 (  six) hours.   prenatal multivitamin Tabs tablet Take 1 tablet by mouth daily at 12 noon.       Diet: routine diet  Activity: Advance as tolerated. Pelvic rest for 6 weeks.   Outpatient follow up:4 weeks   Newborn Data: Live born female  Birth Weight: 8 lb 4 oz (3742 g) APGAR: 9, 9  Baby Feeding: Breast Disposition:home with mother   05/23/2016 Zenaida NieceMEISINGER,Julie Marrone D, MD

## 2016-05-23 NOTE — Progress Notes (Signed)
PPD #2 Doing well Afeb, VSS Fundus firm D/c home 

## 2016-05-23 NOTE — Discharge Instructions (Signed)
As per discharge pamphlet °

## 2016-10-09 DIAGNOSIS — R030 Elevated blood-pressure reading, without diagnosis of hypertension: Secondary | ICD-10-CM | POA: Diagnosis not present

## 2017-01-13 DIAGNOSIS — J029 Acute pharyngitis, unspecified: Secondary | ICD-10-CM | POA: Diagnosis not present

## 2017-01-13 DIAGNOSIS — R030 Elevated blood-pressure reading, without diagnosis of hypertension: Secondary | ICD-10-CM | POA: Diagnosis not present

## 2017-08-24 DIAGNOSIS — Z01419 Encounter for gynecological examination (general) (routine) without abnormal findings: Secondary | ICD-10-CM | POA: Diagnosis not present

## 2017-08-24 DIAGNOSIS — Z124 Encounter for screening for malignant neoplasm of cervix: Secondary | ICD-10-CM | POA: Diagnosis not present

## 2017-09-18 DIAGNOSIS — R109 Unspecified abdominal pain: Secondary | ICD-10-CM | POA: Diagnosis not present

## 2018-07-28 DIAGNOSIS — K136 Irritative hyperplasia of oral mucosa: Secondary | ICD-10-CM | POA: Diagnosis not present

## 2018-08-03 DIAGNOSIS — Z13220 Encounter for screening for lipoid disorders: Secondary | ICD-10-CM | POA: Diagnosis not present

## 2018-08-03 DIAGNOSIS — Z0000 Encounter for general adult medical examination without abnormal findings: Secondary | ICD-10-CM | POA: Diagnosis not present

## 2018-08-03 DIAGNOSIS — J302 Other seasonal allergic rhinitis: Secondary | ICD-10-CM | POA: Diagnosis not present

## 2018-08-03 DIAGNOSIS — R002 Palpitations: Secondary | ICD-10-CM | POA: Diagnosis not present

## 2018-09-20 ENCOUNTER — Ambulatory Visit: Payer: Self-pay | Admitting: Allergy

## 2019-05-10 ENCOUNTER — Telehealth: Payer: PRIVATE HEALTH INSURANCE | Admitting: Family

## 2019-05-10 DIAGNOSIS — Z20822 Contact with and (suspected) exposure to covid-19: Secondary | ICD-10-CM

## 2019-05-10 NOTE — Progress Notes (Signed)
E-Visit for Corona Virus Screening   Your current symptoms could be consistent with the coronavirus.  Many health care providers can now test patients at their office but not all are.  [facility] has multiple testing sites. For information on our COVID testing locations and hours go to [email or web address]  Please quarantine yourself while awaiting your test results.  We are enrolling you in our MyChart Home Montioring for COVID19 . Daily you will receive a questionnaire within the MyChart website. Our COVID 19 response team willl be monitoriing your responses daily.    COVID-19 is a respiratory illness with symptoms that are similar to the flu. Symptoms are typically mild to moderate, but there have been cases of severe illness and death due to the virus. The following symptoms may appear 2-14 days after exposure: . Fever . Cough . Shortness of breath or difficulty breathing . Chills . Repeated shaking with chills . Muscle pain . Headache . Sore throat . New loss of taste or smell . Fatigue . Congestion or runny nose . Nausea or vomiting . Diarrhea  It is vitally important that if you feel that you have an infection such as this virus or any other virus that you stay home and away from places where you may spread it to others.  You should self-quarantine for 14 days if you have symptoms that could potentially be coronavirus or have been in close contact a with a person diagnosed with COVID-19 within the last 2 weeks. You should avoid contact with people age 65 and older.   You should wear a mask or cloth face covering over your nose and mouth if you must be around other people or animals, including pets (even at home). Try to stay at least 6 feet away from other people. This will protect the people around you.  You may also take acetaminophen (Tylenol) as needed for fever.   Reduce your risk of any infection by using the same precautions used for avoiding the  common cold or flu:  . Wash your hands often with soap and warm water for at least 20 seconds.  If soap and water are not readily available, use an alcohol-based hand sanitizer with at least 60% alcohol.  . If coughing or sneezing, cover your mouth and nose by coughing or sneezing into the elbow areas of your shirt or coat, into a tissue or into your sleeve (not your hands). . Avoid shaking hands with others and consider head nods or verbal greetings only. . Avoid touching your eyes, nose, or mouth with unwashed hands.  . Avoid close contact with people who are sick. . Avoid places or events with large numbers of people in one location, like concerts or sporting events. . Carefully consider travel plans you have or are making. . If you are planning any travel outside or inside the [place], visit the CDC's Travelers' Health webpage for the latest health notices. . If you have some symptoms but not all symptoms, continue to monitor at home and seek medical attention if your symptoms worsen. . If you are having a medical emergency, call 911.  HOME CARE . Only take medications as instructed by your medical team. . Drink plenty of fluids and get plenty of rest. . A steam or ultrasonic humidifier can help if you have congestion.   GET HELP RIGHT AWAY IF YOU HAVE EMERGENCY WARNING SIGNS** FOR COVID-19. If you or someone is showing any of these signs seek emergency medical care immediately. Call   911 or proceed to your closest emergency facility if: . You develop worsening high fever. . Trouble breathing . Bluish lips or face . Persistent pain or pressure in the chest . New confusion . Inability to wake or stay awake . You cough up blood. . Your symptoms become more severe  **This list is not all possible symptoms. Contact your medical provider for any symptoms that are sever or concerning to you.   MAKE SURE YOU   Understand these instructions.  Will watch your condition.  Will get help right  away if you are not doing well or get worse.  Your e-visit answers were reviewed by a board certified advanced clinical practitioner to complete your personal care plan.  Depending on the condition, your plan could have included both over the counter or prescription medications.  If there is a problem please reply once you have received a response from your provider.  Your safety is important to us.  If you have drug allergies check your prescription carefully.    You can use MyChart to ask questions about today's visit, request a non-urgent call back, or ask for a work or school excuse for 24 hours related to this e-Visit. If it has been greater than 24 hours you will need to follow up with your provider, or enter a new e-Visit to address those concerns. You will get an e-mail in the next two days asking about your experience.  I hope that your e-visit has been valuable and will speed your recovery. Thank you for using e-visits.   Greater than 5 minutes, yet less than 10 minutes of time have been spent researching, coordinating, and implementing care for this patient today.  Thank you for the details you included in the comment boxes. Those details are very helpful in determining the best course of treatment for you and help us to provide the best care.  

## 2019-11-09 ENCOUNTER — Other Ambulatory Visit: Payer: Self-pay

## 2019-11-09 ENCOUNTER — Emergency Department (HOSPITAL_COMMUNITY)
Admission: EM | Admit: 2019-11-09 | Discharge: 2019-11-09 | Disposition: A | Discharge status: Home / Self Care | Payer: 59 | Attending: Emergency Medicine | Admitting: Emergency Medicine

## 2019-11-09 DIAGNOSIS — M545 Low back pain, unspecified: Secondary | ICD-10-CM

## 2019-11-09 DIAGNOSIS — Z79899 Other long term (current) drug therapy: Secondary | ICD-10-CM | POA: Diagnosis not present

## 2019-11-09 MED ORDER — PREDNISONE 10 MG PO TABS: 20.0000 mg | ORAL_TABLET | Freq: Two times a day (BID) | ORAL | 0 refills | Status: AC

## 2019-11-09 MED ORDER — PREDNISONE 20 MG PO TABS
40.0000 mg | ORAL_TABLET | Freq: Once | ORAL | Status: AC
  Administered 2019-11-09: 40 mg via ORAL
  Filled 2019-11-09: qty 2

## 2019-11-09 MED ORDER — HYDROMORPHONE HCL 2 MG/ML IJ SOLN
2.0000 mg | Freq: Once | INTRAMUSCULAR | Status: AC
  Administered 2019-11-09: 2 mg via INTRAMUSCULAR
  Filled 2019-11-09: qty 1

## 2019-11-09 MED ORDER — ONDANSETRON 4 MG PO TBDP
4.0000 mg | ORAL_TABLET | Freq: Once | ORAL | Status: AC
  Administered 2019-11-09: 4 mg via ORAL
  Filled 2019-11-09: qty 1

## 2019-11-09 MED ORDER — HYDROCODONE-ACETAMINOPHEN 5-325 MG PO TABS: 1.0000 | ORAL_TABLET | Freq: Four times a day (QID) | ORAL | 0 refills | Status: DC | PRN

## 2019-11-09 NOTE — ED Notes (Signed)
Pt verbalizes understanding of DC instructions. Pt belongings returned and is ambulatory out of ED.  

## 2019-11-09 NOTE — ED Triage Notes (Signed)
Patient reports she went to urgent care this morning and they checked her urine and gave her a shot of toradol for back pain and had no signs of a UTI. Patient reports her right side in the lumbar region hurts. Patient reports IUD with no chance of pregnancy. Denies blood clots or vaginal d/c or bleeding.

## 2019-11-09 NOTE — ED Notes (Addendum)
Pt had emesis episode in room post DC. This RN made PA aware and orders PO Zofran and crackers. Pt will now be DCed. Delo MD made aware

## 2019-11-09 NOTE — Discharge Instructions (Addendum)
Begin taking prednisone as prescribed.  Hydrocodone as prescribed as needed for pain.  Follow-up with your primary doctor if symptoms or not improving in the next week to consider physical therapy or possibly imaging studies.  Return to the emergency department in the meantime if symptoms significantly worsen or change.

## 2019-11-09 NOTE — ED Provider Notes (Signed)
[facility] Provider Note   CSN: [id number] Arrival date & time: 11/09/19  1728     History Chief Complaint  Patient presents with  . Back Pain    Julie [name] is a 34 y.o. female.  Patient is a 34 year old female with history of anxiety and prior childbirth.  She presents today for evaluation of severe low back pain.  This began several days ago and is rapidly worsening.  She denies any specific injury or trauma, but does state that she has been working out at the gym more frequently.  She denies any radiation into her legs.  She denies any bowel or bladder complaints.  She denies any weakness or numbness of her extremities.  She has been taking ibuprofen at home with no improvement.  She was given Toradol at the urgent care yesterday which did help, however has since worn off and her pain is worse.  The history is provided by the patient.  Back Pain Location:  Lumbar spine Quality:  Stabbing Radiates to:  Does not radiate Pain severity:  Severe Onset quality:  Sudden Duration:  3 days Timing:  Constant Progression:  Worsening Chronicity:  New Relieved by:  Nothing Worsened by:  Ambulation, palpation, twisting and movement      Past Medical History:  Diagnosis Date  . Anxiety   . Hx of chlamydia infection   . Hx of varicella   . Seasonal allergies   . Vaginal Pap smear, abnormal     Patient Active Problem List   Diagnosis Date Noted  . Term pregnancy, repeat 05/21/2016  . NSVD (normal spontaneous vaginal delivery) 05/21/2016    Past Surgical History:  Procedure Laterality Date  . cleft lip surgery    . COLPOSCOPY    . FOOT SURGERY       OB History    Gravida  2   Para  2   Term  2   Preterm      AB  0   Living  2     SAB  0   TAB      Ectopic      Multiple  0   Live Births  2           Family History  Problem Relation Age of Onset  . Diabetes Father   . Hypertension Father   . Diabetes  Maternal Grandmother   . Hypertension Maternal Grandmother     Social History   Tobacco Use  . Smoking status: Never Smoker  . Smokeless tobacco: Never Used  Substance Use Topics  . Alcohol use: Yes    Comment: occasionally  . Drug use: No    Home Medications Prior to Admission medications   Medication Sig Start Date End Date Taking? Authorizing Provider  acetaminophen (TYLENOL) 500 MG tablet Take 500 mg by mouth every 6 (six) hours as needed for moderate pain.    [provider]  calcium carbonate (TUMS - DOSED IN MG ELEMENTAL CALCIUM) 500 MG chewable tablet Chew 2 tablets by mouth daily as needed for indigestion or heartburn.    [provider]  ibuprofen (ADVIL,MOTRIN) 600 MG tablet Take 1 tablet (600 mg total) by mouth every 6 (six) hours. 05/23/16   Meisinger, Tawanna Cooler, MD  Prenatal Vit-Fe Fumarate-FA (PRENATAL MULTIVITAMIN) TABS tablet Take 1 tablet by mouth daily at 12 noon.    [provider]    Allergies    Patient has no known allergies.  Review of Systems  Review of Systems  Musculoskeletal: Positive for back pain.  All other systems reviewed and are negative.   Physical Exam Updated Vital Signs BP (!) 142/82 (BP Location: Right Arm)   Pulse 84   Temp 98.6 F (37 C) (Oral)   Resp 16   Ht 5\' 5"  (1.651 m)   Wt 108 kg   LMP 10/26/2019 (Approximate)   SpO2 98%   Breastfeeding No   BMI 39.61 kg/m   Physical Exam Vitals and nursing note reviewed.  Constitutional:      General: She is not in acute distress.    Appearance: Normal appearance. She is not ill-appearing, toxic-appearing or diaphoretic.  HENT:     Head: Normocephalic and atraumatic.  Pulmonary:     Effort: Pulmonary effort is normal.  Musculoskeletal:     Comments: There is tenderness to palpation in the soft tissues of the lower lumbar region.  DTRs are trace and symmetrical in the patellar and Achilles tendons bilaterally.  Strength is 5 out of 5 in both lower  extremities.  She is able to ambulate, however with significant discomfort.  Skin:    General: Skin is warm and dry.  Neurological:     Mental Status: She is alert and oriented to person, place, and time.     ED Results / Procedures / Treatments   Labs (all labs ordered are listed, but only abnormal results are displayed) Labs Reviewed - No data to display  EKG None  Radiology No results found.  Procedures Procedures (including critical care time)  Medications Ordered in ED Medications  HYDROmorphone (DILAUDID) injection 2 mg (has no administration in time range)  predniSONE (DELTASONE) tablet 40 mg (has no administration in time range)    ED Course  I have reviewed the triage vital signs and the nursing notes.  Pertinent labs & imaging results that were available during my care of the patient were reviewed by me and considered in my medical decision making (see chart for details).    MDM Rules/Calculators/A&P  Patient presenting here with complaints of severe low back pain in the absence of any injury or trauma.  I suspect a muscle strain and possibly a disc issue.  She has no bowel or bladder complaints and nothing on her exam suggest an emergent situation.  Patient given IM pain medication and will be started on a course of steroids and pain medicine at home.  If she is not improving in the next week, she is to follow-up with her primary doctor.  Final Clinical Impression(s) / ED Diagnoses Final diagnoses:  None    Rx / DC Orders ED Discharge Orders    None       Veryl Speak, MD 11/09/19 2007

## 2022-05-25 ENCOUNTER — Encounter (HOSPITAL_BASED_OUTPATIENT_CLINIC_OR_DEPARTMENT_OTHER): Payer: Self-pay | Admitting: Emergency Medicine

## 2022-05-25 ENCOUNTER — Emergency Department (HOSPITAL_BASED_OUTPATIENT_CLINIC_OR_DEPARTMENT_OTHER): Payer: 59

## 2022-05-25 ENCOUNTER — Emergency Department (HOSPITAL_BASED_OUTPATIENT_CLINIC_OR_DEPARTMENT_OTHER)
Admission: EM | Admit: 2022-05-25 | Discharge: 2022-05-25 | Disposition: A | Discharge status: Home / Self Care | Payer: 59 | Attending: Emergency Medicine | Admitting: Emergency Medicine

## 2022-05-25 DIAGNOSIS — R109 Unspecified abdominal pain: Secondary | ICD-10-CM | POA: Diagnosis present

## 2022-05-25 HISTORY — DX: Essential (primary) hypertension: I10

## 2022-05-25 LAB — COMPREHENSIVE METABOLIC PANEL
ALT: 19 U/L (ref 0–44)
AST: 23 U/L (ref 15–41)
Albumin: 3.6 g/dL (ref 3.5–5.0)
Alkaline Phosphatase: 56 U/L (ref 38–126)
Anion gap: 6 (ref 5–15)
BUN: 15 mg/dL (ref 6–20)
CO2: 26 mmol/L (ref 22–32)
Calcium: 8.6 mg/dL — ABNORMAL LOW (ref 8.9–10.3)
Chloride: 105 mmol/L (ref 98–111)
Creatinine, Ser: 0.84 mg/dL (ref 0.44–1.00)
GFR, Estimated: >60 mL/min (ref >60)
Glucose, Bld: 114 mg/dL — ABNORMAL HIGH (ref 70–99)
Potassium: 3.4 mmol/L — ABNORMAL LOW (ref 3.5–5.1)
Sodium: 137 mmol/L (ref 135–145)
Total Bilirubin: 0.5 mg/dL (ref 0.3–1.2)
Total Protein: 6.7 g/dL (ref 6.5–8.1)

## 2022-05-25 LAB — CBC WITH DIFFERENTIAL/PLATELET
Abs Immature Granulocytes: 0.01 10*3/uL (ref 0.00–0.07)
Basophils Absolute: 0 10*3/uL (ref 0.0–0.1)
Basophils Relative: 1 %
Eosinophils Absolute: 0.1 10*3/uL (ref 0.0–0.5)
Eosinophils Relative: 2 %
HCT: 40.4 % (ref 36.0–46.0)
Hemoglobin: 13.3 g/dL (ref 12.0–15.0)
Immature Granulocytes: 0 %
Lymphocytes Relative: 43 %
Lymphs Abs: 2.7 10*3/uL (ref 0.7–4.0)
MCH: 24.3 pg — ABNORMAL LOW (ref 26.0–34.0)
MCHC: 32.9 g/dL (ref 30.0–36.0)
MCV: 73.7 fL — ABNORMAL LOW (ref 80.0–100.0)
Monocytes Absolute: 0.4 10*3/uL (ref 0.1–1.0)
Monocytes Relative: 6 %
Neutro Abs: 3 10*3/uL (ref 1.7–7.7)
Neutrophils Relative %: 48 %
Platelets: 258 10*3/uL (ref 150–400)
RBC: 5.48 MIL/uL — ABNORMAL HIGH (ref 3.87–5.11)
RDW: 14.7 % (ref 11.5–15.5)
WBC: 6.3 10*3/uL (ref 4.0–10.5)
nRBC: 0 % (ref 0.0–0.2)

## 2022-05-25 LAB — URINALYSIS, ROUTINE W REFLEX MICROSCOPIC
Bilirubin Urine: NEGATIVE
Glucose, UA: NEGATIVE mg/dL
Hgb urine dipstick: NEGATIVE
Ketones, ur: NEGATIVE mg/dL
Leukocytes,Ua: NEGATIVE
Nitrite: NEGATIVE
Protein, ur: NEGATIVE mg/dL
Specific Gravity, Urine: >=1.03 (ref 1.005–1.030)
pH: 5.5 (ref 5.0–8.0)

## 2022-05-25 LAB — LIPASE, BLOOD: Lipase: 28 U/L (ref 11–51)

## 2022-05-25 LAB — PREGNANCY, URINE: Preg Test, Ur: NEGATIVE

## 2022-05-25 MED ORDER — KETOROLAC TROMETHAMINE 15 MG/ML IJ SOLN
15.0000 mg | Freq: Once | INTRAMUSCULAR | Status: DC
  Filled 2022-05-25: qty 1

## 2022-05-25 MED ORDER — KETOROLAC TROMETHAMINE 15 MG/ML IJ SOLN
15.0000 mg | Freq: Once | INTRAMUSCULAR | Status: AC
  Administered 2022-05-25: 15 mg via INTRAVENOUS

## 2022-05-25 NOTE — ED Triage Notes (Signed)
Pt reports RUQ and back pain for the past 2 days. No n/v/d or dysuria. No known injuries.

## 2022-05-25 NOTE — ED Provider Notes (Signed)
[facility] EMERGENCY DEPARTMENT Provider Note   CSN: [id number] Arrival date & time: 05/25/22  1345     History  Chief Complaint  Patient presents with   Flank Pain    Julie [name] is a 36 y.o. female who presents to the emergency department with concerns for flank pain onset 4 days.  Notes that the pain began to radiate to her stomach yesterday.  Notes her symptoms have been worsening.  Still has her gallbladder and appendix.  Has had a UTI before however denies history of kidney stones.  Has tried Aleve and ibuprofen with relief of her symptoms.  Noted that muscle relaxers did not alleviate her symptoms.  Has associated resolved nausea.  Has a primary care provider.  Denies fever, urinary symptoms, vaginal bleeding/discharge, constipation, diarrhea, vomiting.   The history is provided by the patient. No language interpreter was used.       Home Medications Prior to Admission medications   Medication Sig Start Date End Date Taking? Authorizing Provider  acetaminophen (TYLENOL) 500 MG tablet Take 500 mg by mouth every 6 (six) hours as needed for moderate pain.    [provider]  calcium carbonate (TUMS - DOSED IN MG ELEMENTAL CALCIUM) 500 MG chewable tablet Chew 2 tablets by mouth daily as needed for indigestion or heartburn.    [provider]  HYDROcodone-acetaminophen (NORCO) 5-325 MG tablet Take 1-2 tablets by mouth every 6 (six) hours as needed. 11/09/19   Geoffery Lyons, MD  ibuprofen (ADVIL,MOTRIN) 600 MG tablet Take 1 tablet (600 mg total) by mouth every 6 (six) hours. 05/23/16   Meisinger, Tawanna Cooler, MD  predniSONE (DELTASONE) 10 MG tablet Take 2 tablets (20 mg total) by mouth 2 (two) times daily with a meal. 11/09/19   Geoffery Lyons, MD  Prenatal Vit-Fe Fumarate-FA (PRENATAL MULTIVITAMIN) TABS tablet Take 1 tablet by mouth daily at 12 noon.    [provider]      Allergies    Patient has no known allergies.    Review of Systems   Review  of Systems  Constitutional:  Negative for fever.  Gastrointestinal:  Positive for abdominal pain and nausea (resolved). Negative for constipation, diarrhea and vomiting.  Genitourinary:  Positive for flank pain. Negative for dysuria, hematuria, vaginal bleeding and vaginal discharge.  All other systems reviewed and are negative.   Physical Exam Updated Vital Signs BP 127/81 (BP Location: Right Arm)   Pulse 79   Temp 98.6 F (37 C) (Oral)   Resp 18   SpO2 100%  Physical Exam Vitals and nursing note reviewed.  Constitutional:      General: She is not in acute distress.    Appearance: She is not diaphoretic.  HENT:     Head: Normocephalic and atraumatic.     Mouth/Throat:     Pharynx: No oropharyngeal exudate.  Eyes:     General: No scleral icterus.    Conjunctiva/sclera: Conjunctivae normal.  Cardiovascular:     Rate and Rhythm: Normal rate and regular rhythm.     Pulses: Normal pulses.     Heart sounds: Normal heart sounds.  Pulmonary:     Effort: Pulmonary effort is normal. No respiratory distress.     Breath sounds: Normal breath sounds. No wheezing.  Abdominal:     General: Bowel sounds are normal.     Palpations: Abdomen is soft. There is no mass.     Tenderness: There is no abdominal tenderness. There is no right CVA tenderness, left CVA  tenderness, guarding or rebound.     Comments: No abdominal tenderness to palpation noted on exam.  No CVA tenderness to palpation noted bilaterally.  Musculoskeletal:        General: Normal range of motion.     Cervical back: Normal range of motion and neck supple.     Comments: No spinal tenderness to palpation.  No tenderness to palpation noted to musculature of back.  Skin:    General: Skin is warm and dry.  Neurological:     Mental Status: She is alert.  Psychiatric:        Behavior: Behavior normal.     ED Results / Procedures / Treatments   Labs (all labs ordered are listed, but only abnormal results are  displayed) Labs Reviewed  URINALYSIS, ROUTINE W REFLEX MICROSCOPIC  PREGNANCY, URINE  CBC WITH DIFFERENTIAL/PLATELET  COMPREHENSIVE METABOLIC PANEL  LIPASE, BLOOD    EKG None  Radiology CT Renal Stone Study  Result Date: 05/25/2022 CLINICAL DATA:  Flank pain, kidney stone suspected. EXAM: CT ABDOMEN AND PELVIS WITHOUT CONTRAST TECHNIQUE: Multidetector CT imaging of the abdomen and pelvis was performed following the standard protocol without IV contrast. RADIATION DOSE REDUCTION: This exam was performed according to the departmental dose-optimization program which includes automated exposure control, adjustment of the mA and/or kV according to patient size and/or use of iterative reconstruction technique. COMPARISON:  CT examination dated April 22, 2012. FINDINGS: Lower chest: No acute abnormality. Hepatobiliary: No focal liver abnormality is seen. No gallstones, gallbladder wall thickening, or biliary dilatation. Pancreas: Unremarkable. No pancreatic ductal dilatation or surrounding inflammatory changes. Spleen: Normal in size without focal abnormality. Adrenals/Urinary Tract: Adrenal glands are unremarkable. Punctate nonobstructing calculus in the lower pole of the left kidney. No evidence of hydronephrosis or ureteral calculus. Bladder is unremarkable. Stomach/Bowel: Stomach is within normal limits. Appendix not identified, however no inflammatory changes in the pericecal region. No evidence of bowel wall thickening, distention, or inflammatory changes. Moderate amount of stool in the colon suggesting constipation. Vascular/Lymphatic: No significant vascular findings are present. No enlarged abdominal or pelvic lymph nodes. Reproductive: Uterus and bilateral adnexa are unremarkable. Other: No abdominal wall hernia or abnormality. No abdominopelvic ascites. Musculoskeletal: No acute or significant osseous findings. IMPRESSION: 1. Punctate nonobstructing calculus in the lower pole of the left kidney  without evidence of hydronephrosis or ureteral calculus. 2.  No evidence of appendicitis, colitis or diverticulitis. 3.  Uterus and adnexa are unremarkable. 4.  Retained colonic stool suggesting constipation. Electronically Signed   By: Larose Hires D.O.   On: 05/25/2022 15:35    Procedures Procedures    Medications Ordered in ED Medications - No data to display  ED Course/ Medical Decision Making/ A&P Clinical Course as of 05/25/22 1837  Sun May 25, 2022  1830 Patient reevaluated and noted that she still has some pain at this time.  Discussed with patient lab findings.  Offered patient a Percocet prior to discharge however patient declines at this time.  Patient then noted that she was riding go carts prior to the onset of her symptoms.  Discussed with patient that her pain could be musculoskeletal in nature due to riding go carts prior to onset of her symptoms.  Again offered patient medication prior to discharge, patient declines at this time.  Discussed discharge treatment plan.  Patient appears safe to discharge at this time. [SB]    Clinical Course User Index [SB] Geana Walts A, PA-C  Medical Decision Making Amount and/or Complexity of Data Reviewed Labs: ordered. Radiology: ordered.  Risk Prescription drug management.   Pt presents with back pain onset 4 days.  Patient notes that she was riding go carts prior to the onset of her symptoms.  Denies urinary symptoms, resolved nausea, vomiting.  Notes that her pain intermittently radiates to her abdomen.  Still has her gallbladder and appendix.  Has tried ibuprofen with relief of her symptoms.  Vital signs stable, patient afebrile, not tachycardic or hypoxic.  On exam patient with No abdominal tenderness to palpation noted on exam.  No spinal tenderness to palpation.  No CVA tenderness to palpation noted bilaterally.  No spinal tenderness to palpation.  No tenderness to palpation noted to musculature of back.  Otherwise no acute cardiovascular, respiratory, abdominal exam findings.  Differential diagnosis includes musculoskeletal, acute cystitis, pyelonephritis, nephrolithiasis.   Labs:  I ordered, and personally interpreted labs.  The pertinent results include: Urinalysis unremarkable. Urine pregnancy negative. Lipase unremarkable CBC without leukocytosis CMP slightly elevated glucose 114, potassium slightly decreased at 3.4 otherwise unremarkable  Imaging: I ordered imaging studies including CT renal stone study I independently visualized and interpreted imaging which showed:  1. Punctate nonobstructing calculus in the lower pole of the left  kidney without evidence of hydronephrosis or ureteral calculus.    2.  No evidence of appendicitis, colitis or diverticulitis.    3.  Uterus and adnexa are unremarkable.    4.  Retained colonic stool suggesting constipation.   I agree with the radiologist interpretation  Medications:  I ordered medication including Toradol for symptom management Reevaluation of the patient after these medicines and interventions, I reevaluated the patient and found that they have stayed the same I have reviewed the patients home medicines and have made adjustments as needed   Disposition: Presentation suspicious for musculoskeletal concern of back pain..  Patient also with a punctate nephrolithiasis, however nonobstructing, no hydronephrosis noted, patient afebrile, no nausea or emesis noted in ED visit today.  Doubt pyelonephritis or acute cystitis at this time.  After consideration of the diagnostic results and the patients response to treatment, I feel that the patient would benefit from Discharge home.  Offered to send patient home with a prescription for muscle relaxers, patient declines at this time.  Supportive care measures and strict return precautions discussed with patient at bedside. Pt acknowledges and verbalizes understanding. Pt appears safe for  discharge. Follow up as indicated in discharge paperwork.   This chart was dictated using voice recognition software, Dragon. Despite the best efforts of this provider to proofread and correct errors, errors may still occur which can change documentation meaning.  Final Clinical Impression(s) / ED Diagnoses Final diagnoses:  Flank pain    Rx / DC Orders ED Discharge Orders     None         Dantre Yearwood A, PA-C 05/25/22 1838    Virgina Norfolk, DO 05/25/22 2111

## 2022-05-25 NOTE — Discharge Instructions (Addendum)
It was a pleasure taking care of you today!   Your labs were unremarkable today.  Your CT scan showed a punctate nonobstructing kidney stone.  You may continue taking over-the-counter 600 mg ibuprofen every 6 hours or 500 mg Tylenol every 6 hours as needed for pain.  Follow-up with your primary care provider regarding today's ED visit.  You may apply warm compress to your back for up to 15 minutes at a time, ensure to place a barrier between your skin and the warm compress.  Return to the ED if you are experiencing increasing/worsening back pain, vomiting, burning with urination, worsening symptoms.

## 2023-10-08 ENCOUNTER — Emergency Department (HOSPITAL_BASED_OUTPATIENT_CLINIC_OR_DEPARTMENT_OTHER): Payer: 59

## 2023-10-08 ENCOUNTER — Encounter (HOSPITAL_BASED_OUTPATIENT_CLINIC_OR_DEPARTMENT_OTHER): Payer: Self-pay | Admitting: Emergency Medicine

## 2023-10-08 ENCOUNTER — Emergency Department (HOSPITAL_BASED_OUTPATIENT_CLINIC_OR_DEPARTMENT_OTHER)
Admission: EM | Admit: 2023-10-08 | Discharge: 2023-10-08 | Disposition: A | Discharge status: Home / Self Care | Payer: 59 | Attending: Emergency Medicine | Admitting: Emergency Medicine

## 2023-10-08 ENCOUNTER — Other Ambulatory Visit: Payer: Self-pay

## 2023-10-08 DIAGNOSIS — R112 Nausea with vomiting, unspecified: Secondary | ICD-10-CM | POA: Insufficient documentation

## 2023-10-08 DIAGNOSIS — I10 Essential (primary) hypertension: Secondary | ICD-10-CM | POA: Diagnosis not present

## 2023-10-08 DIAGNOSIS — R1011 Right upper quadrant pain: Secondary | ICD-10-CM | POA: Diagnosis present

## 2023-10-08 DIAGNOSIS — R109 Unspecified abdominal pain: Secondary | ICD-10-CM

## 2023-10-08 LAB — CBC WITH DIFFERENTIAL/PLATELET
Abs Immature Granulocytes: 0.03 10*3/uL (ref 0.00–0.07)
Basophils Absolute: 0 10*3/uL (ref 0.0–0.1)
Basophils Relative: 1 %
Eosinophils Absolute: 0.1 10*3/uL (ref 0.0–0.5)
Eosinophils Relative: 2 %
HCT: 41.6 % (ref 36.0–46.0)
Hemoglobin: 13.5 g/dL (ref 12.0–15.0)
Immature Granulocytes: 0 %
Lymphocytes Relative: 39 %
Lymphs Abs: 2.8 10*3/uL (ref 0.7–4.0)
MCH: 23.9 pg — ABNORMAL LOW (ref 26.0–34.0)
MCHC: 32.5 g/dL (ref 30.0–36.0)
MCV: 73.8 fL — ABNORMAL LOW (ref 80.0–100.0)
Monocytes Absolute: 0.5 10*3/uL (ref 0.1–1.0)
Monocytes Relative: 7 %
Neutro Abs: 3.7 10*3/uL (ref 1.7–7.7)
Neutrophils Relative %: 51 %
Platelets: 292 10*3/uL (ref 150–400)
RBC: 5.64 MIL/uL — ABNORMAL HIGH (ref 3.87–5.11)
RDW: 14.7 % (ref 11.5–15.5)
WBC: 7.3 10*3/uL (ref 4.0–10.5)
nRBC: 0 % (ref 0.0–0.2)

## 2023-10-08 LAB — URINALYSIS, ROUTINE W REFLEX MICROSCOPIC
Bilirubin Urine: NEGATIVE
Glucose, UA: NEGATIVE mg/dL
Hgb urine dipstick: NEGATIVE
Ketones, ur: NEGATIVE mg/dL
Nitrite: NEGATIVE
Protein, ur: NEGATIVE mg/dL
Specific Gravity, Urine: 1.02 (ref 1.005–1.030)
pH: 7.5 (ref 5.0–8.0)

## 2023-10-08 LAB — URINALYSIS, MICROSCOPIC (REFLEX): RBC / HPF: NONE SEEN RBC/hpf (ref 0–5)

## 2023-10-08 LAB — COMPREHENSIVE METABOLIC PANEL
ALT: 28 U/L (ref 0–44)
AST: 23 U/L (ref 15–41)
Albumin: 3.8 g/dL (ref 3.5–5.0)
Alkaline Phosphatase: 59 U/L (ref 38–126)
Anion gap: 8 (ref 5–15)
BUN: 14 mg/dL (ref 6–20)
CO2: 26 mmol/L (ref 22–32)
Calcium: 10.1 mg/dL (ref 8.9–10.3)
Chloride: 100 mmol/L (ref 98–111)
Creatinine, Ser: 0.81 mg/dL (ref 0.44–1.00)
GFR, Estimated: >60 mL/min (ref >60)
Glucose, Bld: 114 mg/dL — ABNORMAL HIGH (ref 70–99)
Potassium: 3.9 mmol/L (ref 3.5–5.1)
Sodium: 134 mmol/L — ABNORMAL LOW (ref 135–145)
Total Bilirubin: 0.5 mg/dL (ref 0.0–1.2)
Total Protein: 7.3 g/dL (ref 6.5–8.1)

## 2023-10-08 LAB — PREGNANCY, URINE: Preg Test, Ur: NEGATIVE

## 2023-10-08 LAB — LIPASE, BLOOD: Lipase: 26 U/L (ref 11–51)

## 2023-10-08 MED ORDER — SODIUM CHLORIDE 0.9 % IV BOLUS
1000.0000 mL | Freq: Once | INTRAVENOUS | Status: AC
  Administered 2023-10-08: 1000 mL via INTRAVENOUS

## 2023-10-08 MED ORDER — FENTANYL CITRATE PF 50 MCG/ML IJ SOSY
50.0000 ug | PREFILLED_SYRINGE | Freq: Once | INTRAMUSCULAR | Status: DC
  Filled 2023-10-08: qty 1

## 2023-10-08 MED ORDER — ONDANSETRON HCL 4 MG/2ML IJ SOLN
4.0000 mg | Freq: Once | INTRAMUSCULAR | Status: AC
  Administered 2023-10-08: 4 mg via INTRAVENOUS
  Filled 2023-10-08: qty 2

## 2023-10-08 MED ORDER — ONDANSETRON 4 MG PO TBDP: 4.0000 mg | ORAL_TABLET | Freq: Three times a day (TID) | ORAL | 0 refills | Status: AC | PRN

## 2023-10-08 NOTE — ED Notes (Signed)
 ED Provider at bedside.

## 2023-10-08 NOTE — ED Provider Notes (Signed)
 [place] [facility] AT [facility] Provider Note  CSN: [id number] Arrival date & time: 10/08/23 9472  Chief Complaint(s) Abdominal Pain and Nausea  HPI Julie [name] is a 38 y.o. female     Abdominal Pain Pain location:  RUQ Pain quality: aching   Pain radiates to:  Back Pain severity:  Moderate Onset quality:  Gradual Duration:  8 hours Timing:  Constant Context: eating   Context: not sick contacts and not suspicious food intake   Relieved by:  Nothing Worsened by:  Nothing Associated symptoms: nausea and vomiting   Associated symptoms: no cough, no diarrhea, no dysuria, no fatigue and no fever    Patient reports that she had a GI bug the week before Christmas and improved after a week.  States that she was doing well up until yesterday evening.  Past Medical History Past Medical History:  Diagnosis Date   Anxiety    Hx of chlamydia infection    Hx of varicella    Hypertension    Seasonal allergies    Vaginal Pap smear, abnormal    Patient Active Problem List   Diagnosis Date Noted   Term pregnancy, repeat 05/21/2016   NSVD (normal spontaneous vaginal delivery) 05/21/2016   Home Medication(s) Prior to Admission medications   Medication Sig Start Date End Date Taking? Authorizing Provider  ondansetron  (ZOFRAN -ODT) 4 MG disintegrating tablet Take 1 tablet (4 mg total) by mouth every 8 (eight) hours as needed for up to 3 days for nausea or vomiting. 10/08/23 10/11/23 Yes Krishon Adkison, Raynell Moder, MD  acetaminophen  (TYLENOL ) 500 MG tablet Take 500 mg by mouth every 6 (six) hours as needed for moderate pain.    [provider]  calcium carbonate (TUMS - DOSED IN MG ELEMENTAL CALCIUM) 500 MG chewable tablet Chew 2 tablets by mouth daily as needed for indigestion or heartburn.    [provider]  HYDROcodone -acetaminophen  (NORCO) 5-325 MG tablet Take 1-2 tablets by mouth every 6 (six) hours as needed. 11/09/19   Geroldine Berg, MD   ibuprofen  (ADVIL ,MOTRIN ) 600 MG tablet Take 1 tablet (600 mg total) by mouth every 6 (six) hours. 05/23/16   Meisinger, Krystal, MD  predniSONE  (DELTASONE ) 10 MG tablet Take 2 tablets (20 mg total) by mouth 2 (two) times daily with a meal. 11/09/19   Geroldine Berg, MD  Prenatal Vit-Fe Fumarate-FA (PRENATAL MULTIVITAMIN) TABS tablet Take 1 tablet by mouth daily at 12 noon.    [provider]                                                                                                                                    Allergies Patient has no known allergies.  Review of Systems Review of Systems  Constitutional:  Negative for fatigue and fever.  Respiratory:  Negative for cough.   Gastrointestinal:  Positive for abdominal pain, nausea and vomiting. Negative for diarrhea.  Genitourinary:  Negative  for dysuria.   As noted in HPI  Physical Exam Vital Signs  I have reviewed the triage vital signs BP (!) 166/105 (BP Location: Left Arm)   Pulse 76   Temp 99.1 F (37.3 C) (Oral)   Resp 18   Ht 5' 5 (1.651 m)   Wt 115.7 kg   LMP 09/30/2023 (Approximate)   SpO2 95%   BMI 42.43 kg/m   Physical Exam Vitals reviewed.  Constitutional:      General: She is not in acute distress.    Appearance: She is well-developed and overweight. She is not diaphoretic.  HENT:     Head: Normocephalic and atraumatic.     Right Ear: External ear normal.     Left Ear: External ear normal.     Nose: Nose normal.  Eyes:     General: No scleral icterus.    Conjunctiva/sclera: Conjunctivae normal.  Neck:     Trachea: Phonation normal.  Cardiovascular:     Rate and Rhythm: Normal rate and regular rhythm.  Pulmonary:     Effort: Pulmonary effort is normal. No respiratory distress.     Breath sounds: No stridor.  Abdominal:     General: There is no distension.     Tenderness: There is abdominal tenderness in the right upper quadrant. There is no guarding or rebound. Positive signs include  Murphy's sign.  Musculoskeletal:        General: Normal range of motion.     Cervical back: Normal range of motion.  Neurological:     Mental Status: She is alert and oriented to person, place, and time.  Psychiatric:        Behavior: Behavior normal.     ED Results and Treatments Labs (all labs ordered are listed, but only abnormal results are displayed) Labs Reviewed  COMPREHENSIVE METABOLIC PANEL - Abnormal; Notable for the following components:      Result Value   Sodium 134 (*)    Glucose, Bld 114 (*)    All other components within normal limits  CBC WITH DIFFERENTIAL/PLATELET - Abnormal; Notable for the following components:   RBC 5.64 (*)    MCV 73.8 (*)    MCH 23.9 (*)    All other components within normal limits  URINALYSIS, ROUTINE W REFLEX MICROSCOPIC - Abnormal; Notable for the following components:   Leukocytes,Ua SMALL (*)    All other components within normal limits  URINALYSIS, MICROSCOPIC (REFLEX) - Abnormal; Notable for the following components:   Bacteria, UA RARE (*)    All other components within normal limits  LIPASE, BLOOD  PREGNANCY, URINE                                                                                                                         EKG  EKG Interpretation Date/Time:    Ventricular Rate:    PR Interval:    QRS Duration:    QT Interval:    QTC Calculation:   R  Axis:      Text Interpretation:         Radiology CT Renal Stone Study Result Date: 10/08/2023 CLINICAL DATA:  38 year old female with history of right upper quadrant abdominal pain radiating into the back since 10 p.m. EXAM: CT ABDOMEN AND PELVIS WITHOUT CONTRAST TECHNIQUE: Multidetector CT imaging of the abdomen and pelvis was performed following the standard protocol without IV contrast. RADIATION DOSE REDUCTION: This exam was performed according to the departmental dose-optimization program which includes automated exposure control, adjustment of the mA and/or  kV according to patient size and/or use of iterative reconstruction technique. COMPARISON:  CT of the abdomen and pelvis 05/25/2022. FINDINGS: Lower chest: Unremarkable. Hepatobiliary: No suspicious cystic or solid hepatic lesions. No intra or extrahepatic biliary ductal dilatation. Gallbladder is unremarkable in appearance. Pancreas: No pancreatic mass. No pancreatic ductal dilatation. No pancreatic or peripancreatic fluid collections or inflammatory changes. Spleen: Unremarkable. Adrenals/Urinary Tract: There are no abnormal calcifications within the collecting system of either kidney, along the course of either ureter, or within the lumen of the urinary bladder. No hydroureteronephrosis or perinephric stranding to suggest urinary tract obstruction at this time. The unenhanced appearance of the kidneys is unremarkable bilaterally. Unenhanced appearance of the urinary bladder is unremarkable. Bilateral adrenal glands are normal in appearance. Stomach/Bowel: The appearance of the stomach is normal. No pathologic dilatation of small bowel or colon. Normal appendix. Vascular/Lymphatic: No atherosclerotic calcifications are noted in the abdominal aorta or pelvic vasculature. No lymphadenopathy noted in the abdomen or pelvis. Reproductive: Uterus and ovaries are unremarkable in appearance. Other: No significant volume of ascites.  No pneumoperitoneum. Musculoskeletal: There are no aggressive appearing lytic or blastic lesions noted in the visualized portions of the skeleton. IMPRESSION: 1. No acute findings are noted in the abdomen or pelvis to account for the patient's symptoms. Electronically Signed   By: Toribio Aye M.D.   On: 10/08/2023 07:41    Medications Ordered in ED Medications  sodium chloride  0.9 % bolus 1,000 mL (1,000 mLs Intravenous New Bag/Given 10/08/23 0616)  ondansetron  (ZOFRAN ) injection 4 mg (4 mg Intravenous Given 10/08/23 0617)   Procedures Procedures  (including critical care  time) Medical Decision Making / ED Course   Medical Decision Making Amount and/or Complexity of Data Reviewed Labs: ordered. Decision-making details documented in ED Course. Radiology: ordered.  Risk Prescription drug management.    RUQ pain with N/V ddx considered: Gastritis/duodenitis, pancreatitis, biliary disease, UTI/pyelonephritis, renal colic.   Patient provided with IV fluids and antiemetics.  She declined IV pain medicine. CBC without leukocytosis or anemia. Metabolic panel without significant electrolyte derangements or renal sufficiency. No evidence of bili obstruction or pancreatitis. UA without evidence of infection or hematuria. UPT negative ruling out pregnancy related process. CT stone study negative for obvious stones.  No other intra-abdominal inflammatory/infectious process or bowel obstruction.     Final Clinical Impression(s) / ED Diagnoses Final diagnoses:  Right lateral abdominal pain   The patient appears reasonably screened and/or stabilized for discharge and I doubt any other medical condition or other [facility] requiring further screening, evaluation, or treatment in the ED at this time. I have discussed the findings, Dx and Tx plan with the patient/family who expressed understanding and agree(s) with the plan. Discharge instructions discussed at length. The patient/family was given strict return precautions who verbalized understanding of the instructions. No further questions at time of discharge.  Disposition: Discharge  Condition: Good  ED Discharge Orders  Ordered    ondansetron  (ZOFRAN -ODT) 4 MG disintegrating tablet  Every 8 hours PRN        10/08/23 0754             Follow Up: Primary care provider  Schedule an appointment as soon as possible for a visit      This chart was dictated using voice recognition software.  Despite best efforts to proofread,  errors can occur which can change the documentation meaning.     Trine Raynell Moder, MD 10/08/23 [phone]

## 2023-10-08 NOTE — ED Notes (Signed)
 Patient declined fentanyl. MD made aware.

## 2023-10-08 NOTE — ED Notes (Signed)
 Patient transported to CT

## 2023-10-08 NOTE — ED Triage Notes (Signed)
 Upper right quadrat pain that radiates to back. Since about 2200, with unease and emesis.

## 2024-02-13 ENCOUNTER — Encounter (HOSPITAL_COMMUNITY): Payer: Self-pay

## 2024-02-13 ENCOUNTER — Other Ambulatory Visit: Payer: Self-pay

## 2024-02-13 ENCOUNTER — Emergency Department (HOSPITAL_COMMUNITY)

## 2024-02-13 ENCOUNTER — Emergency Department (HOSPITAL_COMMUNITY)
Admission: EM | Admit: 2024-02-13 | Discharge: 2024-02-13 | Disposition: A | Discharge status: Home / Self Care | Attending: Emergency Medicine | Admitting: Emergency Medicine

## 2024-02-13 DIAGNOSIS — I10 Essential (primary) hypertension: Secondary | ICD-10-CM | POA: Insufficient documentation

## 2024-02-13 DIAGNOSIS — K8020 Calculus of gallbladder without cholecystitis without obstruction: Secondary | ICD-10-CM | POA: Insufficient documentation

## 2024-02-13 DIAGNOSIS — R1011 Right upper quadrant pain: Secondary | ICD-10-CM | POA: Diagnosis present

## 2024-02-13 LAB — URINALYSIS, ROUTINE W REFLEX MICROSCOPIC
Bilirubin Urine: NEGATIVE
Glucose, UA: NEGATIVE mg/dL
Hgb urine dipstick: NEGATIVE
Ketones, ur: NEGATIVE mg/dL
Leukocytes,Ua: NEGATIVE
Nitrite: NEGATIVE
Protein, ur: NEGATIVE mg/dL
Specific Gravity, Urine: 1.006 (ref 1.005–1.030)
pH: 7 (ref 5.0–8.0)

## 2024-02-13 LAB — COMPREHENSIVE METABOLIC PANEL WITH GFR
ALT: 26 U/L (ref 0–44)
AST: 28 U/L (ref 15–41)
Albumin: 3.6 g/dL (ref 3.5–5.0)
Alkaline Phosphatase: 50 U/L (ref 38–126)
Anion gap: 14 (ref 5–15)
BUN: 11 mg/dL (ref 6–20)
CO2: 23 mmol/L (ref 22–32)
Calcium: 9.3 mg/dL (ref 8.9–10.3)
Chloride: 101 mmol/L (ref 98–111)
Creatinine, Ser: 1.04 mg/dL — ABNORMAL HIGH (ref 0.44–1.00)
GFR, Estimated: >60 mL/min (ref >60)
Glucose, Bld: 135 mg/dL — ABNORMAL HIGH (ref 70–99)
Potassium: 3.6 mmol/L (ref 3.5–5.1)
Sodium: 138 mmol/L (ref 135–145)
Total Bilirubin: 0.4 mg/dL (ref 0.0–1.2)
Total Protein: 6.9 g/dL (ref 6.5–8.1)

## 2024-02-13 LAB — CBC WITH DIFFERENTIAL/PLATELET
Abs Immature Granulocytes: 0.02 10*3/uL (ref 0.00–0.07)
Basophils Absolute: 0 10*3/uL (ref 0.0–0.1)
Basophils Relative: 1 %
Eosinophils Absolute: 0.1 10*3/uL (ref 0.0–0.5)
Eosinophils Relative: 1 %
HCT: 38.8 % (ref 36.0–46.0)
Hemoglobin: 13 g/dL (ref 12.0–15.0)
Immature Granulocytes: 0 %
Lymphocytes Relative: 36 %
Lymphs Abs: 2.3 10*3/uL (ref 0.7–4.0)
MCH: 24.6 pg — ABNORMAL LOW (ref 26.0–34.0)
MCHC: 33.5 g/dL (ref 30.0–36.0)
MCV: 73.5 fL — ABNORMAL LOW (ref 80.0–100.0)
Monocytes Absolute: 0.4 10*3/uL (ref 0.1–1.0)
Monocytes Relative: 7 %
Neutro Abs: 3.5 10*3/uL (ref 1.7–7.7)
Neutrophils Relative %: 55 %
Platelets: 244 10*3/uL (ref 150–400)
RBC: 5.28 MIL/uL — ABNORMAL HIGH (ref 3.87–5.11)
RDW: 14.4 % (ref 11.5–15.5)
WBC: 6.4 10*3/uL (ref 4.0–10.5)
nRBC: 0 % (ref 0.0–0.2)

## 2024-02-13 LAB — LIPASE, BLOOD: Lipase: 30 U/L (ref 11–51)

## 2024-02-13 LAB — HCG, SERUM, QUALITATIVE: Preg, Serum: NEGATIVE

## 2024-02-13 MED ORDER — ONDANSETRON HCL 4 MG/2ML IJ SOLN
4.0000 mg | Freq: Once | INTRAMUSCULAR | Status: AC
  Administered 2024-02-13: 4 mg via INTRAVENOUS
  Filled 2024-02-13: qty 2

## 2024-02-13 MED ORDER — MORPHINE SULFATE (PF) 4 MG/ML IV SOLN
4.0000 mg | Freq: Once | INTRAVENOUS | Status: AC
  Administered 2024-02-13: 4 mg via INTRAVENOUS
  Filled 2024-02-13: qty 1

## 2024-02-13 MED ORDER — OXYCODONE HCL 5 MG PO TABS: 5.0000 mg | ORAL_TABLET | Freq: Four times a day (QID) | ORAL | 0 refills | Status: AC | PRN

## 2024-02-13 MED ORDER — ONDANSETRON HCL 4 MG PO TABS: 4.0000 mg | ORAL_TABLET | Freq: Four times a day (QID) | ORAL | 0 refills | Status: AC

## 2024-02-13 NOTE — Discharge Instructions (Addendum)
 Please follow-up with with the general surgeon in regards to his symptoms and ER visit along with your primary care provider.  You have been seen in the Emergency Department (ED) for abdominal pain.  Your evaluation suggests that your pain is caused by gallstones.  Fortunately you do not need immediate surgery at this time, but it is important that you follow up with a surgeon as an outpatient; typically surgical removal of the gallbladder is the only thing that will definitively fix your issue.  Read through the included information about a bland diet, and use any prescribed medications as instructed.  Avoid smoking and alcohol use.  I have prescribed Zofran  to help with any nausea might have at home.  Please follow up as instructed above regarding today's emergent visit and the symptoms that are bothering you.  Take Oxycodone  as prescribed. Do not drink alcohol, drive or participate in any other potentially dangerous activities while taking this medication as it may make you sleepy. Do not take this medication with any other sedating medications, either prescription or over-the-counter. If you were prescribed Percocet or Vicodin, do not take these with acetaminophen  (Tylenol ) as it is already contained within these medications.   This medication is an opiate (or narcotic) pain medication and can be habit forming.  Use it as little as possible to achieve adequate pain control.  Do not use or use it with extreme caution if you have a history of opiate abuse or dependence.  If you are on a pain contract with your primary care doctor or a pain specialist, be sure to let them know you were prescribed this medication today from the [facility] Emergency Department.  This medication is intended for your use only - do not give any to anyone else and keep it in a secure place where nobody else, especially children, have access to it.  It will also cause or worsen constipation, so you may want to consider taking an  over-the-counter stool softener while you are taking this medication.  Return to the ED if your abdominal pain worsens or fails to improve, you develop bloody vomiting, bloody diarrhea, you are unable to tolerate fluids due to vomiting, fever greater than 101, or other symptoms that concern you.

## 2024-02-13 NOTE — ED Notes (Signed)
 Patient Alert and oriented to baseline. Stable and ambulatory to baseline. Patient verbalized understanding of the discharge instructions.  Patient belongings were taken by the patient.

## 2024-02-13 NOTE — ED Provider Notes (Signed)
 Patient given in sign out by [place], PA-C.  Please review their note for patient HPI, physical exam, workup.  At this time the plan is follow-up on ultrasound and plan on discharge if p.o. challenge.  Patient's ultrasound shows gallstone mostly causing patient's pain.  No signs of infection and patient does not have white count here nor endorsing any infectious symptoms that necessitate HIDA scan at this time.  I discussed with the patient and patient is comfortable going home.  Patient successfully p.o. challenge at this time.  Will prescribe Zofran  along with oxycodone .  I did discuss the risks of the oxycodone  do not operate machinery or drive afterwards which patient agrees.  Patient states she will take the oxycodone  and Tylenol  at home does not work.  Will have her follow-up with a general surgeon.  We discussed return precautions.  Patient stable to be discharged.  Patient verbalized understanding and acceptance of this plan.   Denese Finn, PA-C 02/13/24 0746    Alissa April, MD 02/13/24 2237

## 2024-02-13 NOTE — ED Triage Notes (Signed)
 Pt reports RUQ abd pain for the last three hours that radiates to back. Pt given 50mcg fentanyl  PTA. Pt reports emesis as well tonight. Pt reports she has been taking black flax seed detox for the last couple days.

## 2024-02-13 NOTE — ED Provider Notes (Signed)
 [place] EMERGENCY DEPARTMENT AT [facility] Provider Note   CSN: [id number] Arrival date & time: 02/13/24  1191     History  Chief Complaint  Patient presents with   Abdominal Pain    Julie [name] is a 38 y.o. female. Patient with past medical history significant for seasonal allergies presents the emergency room via EMS complaining of right upper quadrant abdominal pain.  She states the pain began at approximately 11 PM.  EMS administered 50 mcg of fentanyl  prior to arrival.  Patient also endorses nausea and vomiting that began this evening.  She states she has had intermittent right upper quadrant abdominal pain since January of this year.  She had a CT scan in January which was grossly unremarkable.  The patient does endorse taking a black flaxseed detox for the past few days to help with constipation.  She denies chest pain, shortness of breath, fevers.   Abdominal Pain      Home Medications Prior to Admission medications   Medication Sig Start Date End Date Taking? Authorizing Provider  acetaminophen  (TYLENOL ) 500 MG tablet Take 500 mg by mouth every 6 (six) hours as needed for moderate pain.    [provider]  calcium carbonate (TUMS - DOSED IN MG ELEMENTAL CALCIUM) 500 MG chewable tablet Chew 2 tablets by mouth daily as needed for indigestion or heartburn.    [provider]  HYDROcodone -acetaminophen  (NORCO) 5-325 MG tablet Take 1-2 tablets by mouth every 6 (six) hours as needed. 11/09/19   Orvilla Blander, MD  ibuprofen  (ADVIL ,MOTRIN ) 600 MG tablet Take 1 tablet (600 mg total) by mouth every 6 (six) hours. 05/23/16   Meisinger, Ena Harries, MD  predniSONE  (DELTASONE ) 10 MG tablet Take 2 tablets (20 mg total) by mouth 2 (two) times daily with a meal. 11/09/19   Orvilla Blander, MD  Prenatal Vit-Fe Fumarate-FA (PRENATAL MULTIVITAMIN) TABS tablet Take 1 tablet by mouth daily at 12 noon.    [provider]      Allergies    Patient has no known  allergies.    Review of Systems   Review of Systems  Gastrointestinal:  Positive for abdominal pain.    Physical Exam Updated Vital Signs BP 118/81   Pulse 92   Temp 99.3 F (37.4 C) (Oral)   Resp 17   Ht 5\' 5"  (1.651 m)   Wt 115.7 kg   LMP 02/04/2024 (Approximate)   SpO2 100%   BMI 42.43 kg/m  Physical Exam Vitals and nursing note reviewed.  Constitutional:      General: She is not in acute distress.    Appearance: She is well-developed.  HENT:     Head: Normocephalic and atraumatic.  Eyes:     Conjunctiva/sclera: Conjunctivae normal.  Cardiovascular:     Rate and Rhythm: Normal rate and regular rhythm.     Heart sounds: No murmur heard. Pulmonary:     Effort: Pulmonary effort is normal. No respiratory distress.     Breath sounds: Normal breath sounds.  Abdominal:     Palpations: Abdomen is soft.     Tenderness: There is abdominal tenderness in the right upper quadrant. Positive signs include Murphy's sign.  Musculoskeletal:        General: No swelling.     Cervical back: Neck supple.  Skin:    General: Skin is warm and dry.     Capillary Refill: Capillary refill takes less than 2 seconds.  Neurological:     Mental Status: She is alert.  Psychiatric:        Mood and Affect: Mood normal.     ED Results / Procedures / Treatments   Labs (all labs ordered are listed, but only abnormal results are displayed) Labs Reviewed  CBC WITH DIFFERENTIAL/PLATELET - Abnormal; Notable for the following components:      Result Value   RBC 5.28 (*)    MCV 73.5 (*)    MCH 24.6 (*)    All other components within normal limits  COMPREHENSIVE METABOLIC PANEL WITH GFR - Abnormal; Notable for the following components:   Glucose, Bld 135 (*)    Creatinine, Ser 1.04 (*)    All other components within normal limits  URINALYSIS, ROUTINE W REFLEX MICROSCOPIC - Abnormal; Notable for the following components:   Color, Urine STRAW (*)    All other components within normal limits   LIPASE, BLOOD  HCG, SERUM, QUALITATIVE    EKG None  Radiology No results found.  Procedures Procedures    Medications Ordered in ED Medications  morphine (PF) 4 MG/ML injection 4 mg (4 mg Intravenous Given 02/13/24 0349)  ondansetron  (ZOFRAN ) injection 4 mg (4 mg Intravenous Given 02/13/24 0349)    ED Course/ Medical Decision Making/ A&P                                 Medical Decision Making Amount and/or Complexity of Data Reviewed Labs: ordered. Radiology: ordered.  Risk Prescription drug management.   This patient presents to the ED for concern of right upper quadrant abdominal pain with nausea and vomiting, this involves an extensive number of treatment options, and is a complaint that carries with it a high risk of complications and morbidity.  The differential diagnosis includes cholecystitis, cholangitis, biliary colic, gastritis, others   Co morbidities that complicate the patient evaluation  Anxiety, hypertension   Additional history obtained:  Additional history obtained from EMS External records from outside source obtained and reviewed including family medicine notes   Lab Tests:  I Ordered, and personally interpreted labs.  The pertinent results include:  Grossly unremarkable CMP, CBC, UA, lipase   Imaging Studies ordered:  I ordered imaging studies including right upper quadrant ultrasound Imaging pending    Problem List / ED Course / Critical interventions / Medication management   I ordered medication including morphine for pain, Zofran  for nausea Reevaluation of the patient after these medicines showed that the patient improved I have reviewed the patients home medicines and have made adjustments as needed  Test / Admission - Considered:  Patient care being transferred to Melda Spore, PA-C at shift handoff. Disposition pending reassessment and imaging results.          Final Clinical Impression(s) / ED Diagnoses Final  diagnoses:  None    Rx / DC Orders ED Discharge Orders     None         Delories Fetter 02/13/24 1191    Alissa April, MD 02/13/24 [phone]

## 2024-03-03 NOTE — Progress Notes (Signed)
 Surgical Instructions   Your procedure is scheduled on Monday, June 9th, 2025. Report to [facility] Main Entrance "A" at 11:00 A.M., then check in with the Admitting office. Any questions or running late day of surgery: call [phone]  Questions prior to your surgery date: call [phone], Monday-Friday, 8am-4pm. If you experience any cold or flu symptoms such as cough, fever, chills, shortness of breath, etc. between now and your scheduled surgery, please notify us  at the above number.     Remember:  Do not eat after midnight the night before your surgery   You may drink clear liquids until 10:00 the morning of your surgery.   Clear liquids allowed are: Water, Non-Citrus Juices (without pulp), Carbonated Beverages, Clear Tea (no milk, honey, etc.), Black Coffee Only (NO MILK, CREAM OR POWDERED CREAMER of any kind), and Gatorade.    Take these medicines the morning of surgery with A SIP OF WATER: Loratadine (Claritin)   May take these medicines IF NEEDED: Acetaminophen  (Tylenol ) Fluticasone (Flonase) Ondansetron  (Zofran ) Pantoprazole (Protonix)    One week prior to surgery, STOP taking any Aspirin (unless otherwise instructed by your surgeon) Aleve, Naproxen, Ibuprofen , Motrin , Advil , Goody's, BC's, all herbal medications, fish oil, and non-prescription vitamins.                     Do NOT Smoke (Tobacco/Vaping) for 24 hours prior to your procedure.  If you use a CPAP at night, you may bring your mask/headgear for your overnight stay.   You will be asked to remove any contacts, glasses, piercing's, hearing aid's, dentures/partials prior to surgery. Please bring cases for these items if needed.    Patients discharged the day of surgery will not be allowed to drive home, and someone needs to stay with them for 24 hours.  SURGICAL WAITING ROOM VISITATION Patients may have no more than 2 support people in the waiting area - these visitors may rotate.   Pre-op nurse will  coordinate an appropriate time for 1 ADULT support person, who may not rotate, to accompany patient in pre-op.  Children under the age of 8 must have an adult with them who is not the patient and must remain in the main waiting area with an adult.  If the patient needs to stay at the hospital during part of their recovery, the visitor guidelines for inpatient rooms apply.  Please refer to the [facility] website for the visitor guidelines for any additional information.   If you received a COVID test during your pre-op visit  it is requested that you wear a mask when out in public, stay away from anyone that may not be feeling well and notify your surgeon if you develop symptoms. If you have been in contact with anyone that has tested positive in the last 10 days please notify you surgeon.      Pre-operative CHG Bathing Instructions   You can play a key role in reducing the risk of infection after surgery. Your skin needs to be as free of germs as possible. You can reduce the number of germs on your skin by washing with CHG (chlorhexidine gluconate) soap before surgery. CHG is an antiseptic soap that kills germs and continues to kill germs even after washing.   DO NOT use if you have an allergy to chlorhexidine/CHG or antibacterial soaps. If your skin becomes reddened or irritated, stop using the CHG and notify one of our RNs at [phone].  TAKE A SHOWER THE NIGHT BEFORE SURGERY AND THE DAY OF SURGERY    Please keep in mind the following:  DO NOT shave, including legs and underarms, 48 hours prior to surgery.   You may shave your face before/day of surgery.  Place clean sheets on your bed the night before surgery Use a clean washcloth (not used since being washed) for each shower. DO NOT sleep with pet's night before surgery.  CHG Shower Instructions:  Wash your face and private area with normal soap. If you choose to wash your hair, wash first with your normal shampoo.   After you use shampoo/soap, rinse your hair and body thoroughly to remove shampoo/soap residue.  Turn the water OFF and apply half the bottle of CHG soap to a CLEAN washcloth.  Apply CHG soap ONLY FROM YOUR NECK DOWN TO YOUR TOES (washing for 3-5 minutes)  DO NOT use CHG soap on face, private areas, open wounds, or sores.  Pay special attention to the area where your surgery is being performed.  If you are having back surgery, having someone wash your back for you may be helpful. Wait 2 minutes after CHG soap is applied, then you may rinse off the CHG soap.  Pat dry with a clean towel  Put on clean pajamas    Additional instructions for the day of surgery: DO NOT APPLY any lotions, deodorants, cologne, or perfumes.   Do not wear jewelry or makeup Do not wear nail polish, gel polish, artificial nails, or any other type of covering on natural nails (fingers and toes) Do not bring valuables to the hospital. [facility] is not responsible for valuables/personal belongings. Put on clean/comfortable clothes.  Please brush your teeth.  Ask your nurse before applying any prescription medications to the skin.

## 2024-03-04 ENCOUNTER — Encounter (HOSPITAL_COMMUNITY)
Admission: RE | Admit: 2024-03-04 | Discharge: 2024-03-04 | Disposition: A | Discharge status: Home / Self Care | Source: Ambulatory Visit | Attending: Surgery | Admitting: Surgery

## 2024-03-04 ENCOUNTER — Encounter (HOSPITAL_COMMUNITY): Payer: Self-pay

## 2024-03-04 ENCOUNTER — Other Ambulatory Visit: Payer: Self-pay

## 2024-03-04 DIAGNOSIS — Z01818 Encounter for other preprocedural examination: Secondary | ICD-10-CM | POA: Insufficient documentation

## 2024-03-04 NOTE — Progress Notes (Signed)
 Requested pre-surgical orders from Dr. Aniceto Barley.

## 2024-03-04 NOTE — Progress Notes (Signed)
 PCP - Dr. Dorthula Gavel Cardiologist - Dr. Burdette Carolin - LOV 08/19/23 w/FU as needed.  PPM/ICD - Denies Device Orders - N/A Rep Notified - N/A  Chest x-ray - N/A EKG - 08/19/23 - requested from [facility] Stress Test - Denies ECHO - Denies Cardiac Cath - Denies  Sleep Study - Denies CPAP - n/a  NON-diabetic  Last dose of GLP1 agonist-  Denies GLP1 instructions: n/a  Blood Thinner Instructions:Denies Aspirin Instructions:Denies  ERAS Protcol - Clears til  1100 PRE-SURGERY Ensure or G2- N/A   COVID TEST- N/A   Anesthesia review: Yes, HTN   Patient denies shortness of breath, fever, cough and chest pain at PAT appointment. Patient denies any respiratory issues at this time.    All instructions explained to the patient, with a verbal understanding of the material. Patient agrees to go over the instructions while at home for a better understanding. Patient also instructed to self quarantine after being tested for COVID-19. The opportunity to ask questions was provided.

## 2024-03-09 ENCOUNTER — Ambulatory Visit: Payer: Self-pay | Admitting: Surgery

## 2024-03-13 NOTE — Anesthesia Preprocedure Evaluation (Signed)
 Anesthesia Evaluation  Patient identified by MRN, date of birth, ID band Patient awake    Reviewed: Allergy & Precautions, NPO status , Patient's Chart, lab work & pertinent test results  Airway Mallampati: II  TM Distance: >3 FB Neck ROM: Full    Dental no notable dental hx. (+) Teeth Intact, Dental Advisory Given   Pulmonary    Pulmonary exam normal breath sounds clear to auscultation       Cardiovascular hypertension, Pt. on medications (-) angina (-) Past MI Normal cardiovascular exam Rhythm:Regular Rate:Normal     Neuro/Psych   Anxiety     negative neurological ROS     GI/Hepatic ,GERD  Medicated and Controlled,,  Endo/Other    Class 3 obesity  Renal/GU Lab Results      Component                Value               Date                           K                        3.6                 02/13/2024                CO2                      23                  02/13/2024                BUN                      11                  02/13/2024                CREATININE               1.04 (H)            02/13/2024                    Musculoskeletal negative musculoskeletal ROS (+)    Abdominal   Peds  Hematology Lab Results      Component                Value               Date                      WBC                      6.4                 02/13/2024                HGB                      13.0                02/13/2024                HCT  38.8                02/13/2024                MCV                      73.5 (L)            02/13/2024                PLT                      244                 02/13/2024              Anesthesia Other Findings   Reproductive/Obstetrics negative OB ROS                             Anesthesia Physical Anesthesia Plan  ASA: 3  Anesthesia Plan: General   Post-op Pain Management: Toradol  IV (intra-op)*, Precedex and Tylenol   PO (pre-op)*   Induction: Intravenous  PONV Risk Score and Plan: 4 or greater and Midazolam, Dexamethasone, Ondansetron  and Treatment may vary due to age or medical condition  Airway Management Planned: Oral ETT  Additional Equipment: None  Intra-op Plan:   Post-operative Plan: Extubation in OR  Informed Consent: I have reviewed the patients History and Physical, chart, labs and discussed the procedure including the risks, benefits and alternatives for the proposed anesthesia with the patient or authorized representative who has indicated his/her understanding and acceptance.     Dental advisory given  Plan Discussed with: CRNA and Surgeon  Anesthesia Plan Comments:        Anesthesia Quick Evaluation

## 2024-03-14 ENCOUNTER — Encounter (HOSPITAL_COMMUNITY): Payer: Self-pay | Admitting: Surgery

## 2024-03-14 ENCOUNTER — Other Ambulatory Visit: Payer: Self-pay

## 2024-03-14 ENCOUNTER — Encounter (HOSPITAL_COMMUNITY)
Admission: RE | Disposition: A | Discharge status: Home / Self Care | Payer: Self-pay | Source: Home / Self Care | Attending: Surgery

## 2024-03-14 ENCOUNTER — Ambulatory Visit (HOSPITAL_COMMUNITY)
Admission: RE | Admit: 2024-03-14 | Discharge: 2024-03-14 | Disposition: A | Discharge status: Home / Self Care | Attending: Surgery | Admitting: Surgery

## 2024-03-14 ENCOUNTER — Ambulatory Visit (HOSPITAL_COMMUNITY): Payer: Self-pay | Admitting: Anesthesiology

## 2024-03-14 ENCOUNTER — Ambulatory Visit (HOSPITAL_COMMUNITY): Payer: Self-pay | Admitting: Physician Assistant

## 2024-03-14 DIAGNOSIS — K8010 Calculus of gallbladder with chronic cholecystitis without obstruction: Secondary | ICD-10-CM | POA: Insufficient documentation

## 2024-03-14 DIAGNOSIS — I10 Essential (primary) hypertension: Secondary | ICD-10-CM | POA: Insufficient documentation

## 2024-03-14 DIAGNOSIS — K8020 Calculus of gallbladder without cholecystitis without obstruction: Secondary | ICD-10-CM

## 2024-03-14 DIAGNOSIS — Z8249 Family history of ischemic heart disease and other diseases of the circulatory system: Secondary | ICD-10-CM | POA: Insufficient documentation

## 2024-03-14 DIAGNOSIS — K219 Gastro-esophageal reflux disease without esophagitis: Secondary | ICD-10-CM | POA: Insufficient documentation

## 2024-03-14 DIAGNOSIS — E66813 Obesity, class 3: Secondary | ICD-10-CM | POA: Diagnosis not present

## 2024-03-14 DIAGNOSIS — Z6841 Body Mass Index (BMI) 40.0 and over, adult: Secondary | ICD-10-CM | POA: Insufficient documentation

## 2024-03-14 HISTORY — PX: CHOLECYSTECTOMY: SHX55

## 2024-03-14 LAB — POCT PREGNANCY, URINE: Preg Test, Ur: NEGATIVE

## 2024-03-14 SURGERY — LAPAROSCOPIC CHOLECYSTECTOMY
Anesthesia: General | Site: Abdomen

## 2024-03-14 MED ORDER — 0.9 % SODIUM CHLORIDE (POUR BTL) OPTIME
TOPICAL | Status: DC | PRN
  Administered 2024-03-14: 1000 mL

## 2024-03-14 MED ORDER — OXYCODONE HCL 5 MG PO TABS
5.0000 mg | ORAL_TABLET | Freq: Once | ORAL | Status: AC | PRN
  Administered 2024-03-14: 5 mg via ORAL

## 2024-03-14 MED ORDER — PROPOFOL 10 MG/ML IV BOLUS
INTRAVENOUS | Status: DC | PRN
  Administered 2024-03-14: 170 mg via INTRAVENOUS

## 2024-03-14 MED ORDER — CEFAZOLIN SODIUM-DEXTROSE 2-4 GM/100ML-% IV SOLN
2.0000 g | INTRAVENOUS | Status: AC
  Administered 2024-03-14: 3 g via INTRAVENOUS
  Filled 2024-03-14: qty 100

## 2024-03-14 MED ORDER — IBUPROFEN 600 MG PO TABS: 600.0000 mg | ORAL_TABLET | Freq: Four times a day (QID) | ORAL | 1 refills | Status: AC

## 2024-03-14 MED ORDER — ACETAMINOPHEN 500 MG PO TABS: 1000.0000 mg | ORAL_TABLET | Freq: Four times a day (QID) | ORAL | 3 refills | Status: AC

## 2024-03-14 MED ORDER — CHLORHEXIDINE GLUCONATE CLOTH 2 % EX PADS: 6.0000 | MEDICATED_PAD | Freq: Once | CUTANEOUS | Status: DC

## 2024-03-14 MED ORDER — OXYCODONE HCL 5 MG PO TABS: 5.0000 mg | ORAL_TABLET | ORAL | 0 refills | Status: AC | PRN

## 2024-03-14 MED ORDER — KETOROLAC TROMETHAMINE 30 MG/ML IJ SOLN
30.0000 mg | Freq: Once | INTRAMUSCULAR | Status: AC | PRN
  Administered 2024-03-14: 30 mg via INTRAVENOUS

## 2024-03-14 MED ORDER — OXYCODONE HCL 5 MG PO TABS
ORAL_TABLET | ORAL | Status: AC
  Filled 2024-03-14: qty 1

## 2024-03-14 MED ORDER — DOCUSATE SODIUM 100 MG PO CAPS: 100.0000 mg | ORAL_CAPSULE | Freq: Two times a day (BID) | ORAL | 2 refills | Status: AC

## 2024-03-14 MED ORDER — SODIUM CHLORIDE 0.9 % IR SOLN
Status: DC | PRN
  Administered 2024-03-14: 1000 mL

## 2024-03-14 MED ORDER — ONDANSETRON HCL 4 MG/2ML IJ SOLN: 4.0000 mg | Freq: Once | INTRAMUSCULAR | Status: DC | PRN

## 2024-03-14 MED ORDER — MIDAZOLAM HCL 2 MG/2ML IJ SOLN
INTRAMUSCULAR | Status: DC | PRN
  Administered 2024-03-14: 2 mg via INTRAVENOUS

## 2024-03-14 MED ORDER — HYDROMORPHONE HCL 1 MG/ML IJ SOLN
0.2500 mg | INTRAMUSCULAR | Status: DC | PRN
  Administered 2024-03-14: 0.25 mg via INTRAVENOUS

## 2024-03-14 MED ORDER — LACTATED RINGERS IV SOLN: INTRAVENOUS | Status: DC

## 2024-03-14 MED ORDER — LIDOCAINE 2% (20 MG/ML) 5 ML SYRINGE
INTRAMUSCULAR | Status: DC | PRN
  Administered 2024-03-14: 100 mg via INTRAVENOUS

## 2024-03-14 MED ORDER — METHOCARBAMOL 750 MG PO TABS: 750.0000 mg | ORAL_TABLET | Freq: Four times a day (QID) | ORAL | 1 refills | Status: AC

## 2024-03-14 MED ORDER — OXYCODONE HCL 5 MG/5ML PO SOLN: 5.0000 mg | Freq: Once | ORAL | Status: AC | PRN

## 2024-03-14 MED ORDER — PHENYLEPHRINE 80 MCG/ML (10ML) SYRINGE FOR IV PUSH (FOR BLOOD PRESSURE SUPPORT)
PREFILLED_SYRINGE | INTRAVENOUS | Status: DC | PRN
  Administered 2024-03-14 (×3): 80 ug via INTRAVENOUS

## 2024-03-14 MED ORDER — KETOROLAC TROMETHAMINE 30 MG/ML IJ SOLN
INTRAMUSCULAR | Status: AC
  Filled 2024-03-14: qty 1

## 2024-03-14 MED ORDER — DEXMEDETOMIDINE HCL IN NACL 80 MCG/20ML IV SOLN
INTRAVENOUS | Status: DC | PRN
  Administered 2024-03-14: 8 ug via INTRAVENOUS
  Administered 2024-03-14: 12 ug via INTRAVENOUS

## 2024-03-14 MED ORDER — FENTANYL CITRATE (PF) 250 MCG/5ML IJ SOLN
INTRAMUSCULAR | Status: AC
Start: 2024-03-14 — End: ?
  Filled 2024-03-14: qty 5

## 2024-03-14 MED ORDER — ROCURONIUM BROMIDE 10 MG/ML (PF) SYRINGE
PREFILLED_SYRINGE | INTRAVENOUS | Status: DC | PRN
  Administered 2024-03-14: 80 mg via INTRAVENOUS

## 2024-03-14 MED ORDER — BUPIVACAINE-EPINEPHRINE 0.25% -1:200000 IJ SOLN
INTRAMUSCULAR | Status: DC | PRN
  Administered 2024-03-14: 30 mL

## 2024-03-14 MED ORDER — DEXAMETHASONE SODIUM PHOSPHATE 10 MG/ML IJ SOLN
INTRAMUSCULAR | Status: DC | PRN
  Administered 2024-03-14: 10 mg via INTRAVENOUS

## 2024-03-14 MED ORDER — SUGAMMADEX SODIUM 200 MG/2ML IV SOLN
INTRAVENOUS | Status: DC | PRN
  Administered 2024-03-14: 250 mg via INTRAVENOUS

## 2024-03-14 MED ORDER — FENTANYL CITRATE (PF) 250 MCG/5ML IJ SOLN
INTRAMUSCULAR | Status: DC | PRN
  Administered 2024-03-14 (×3): 50 ug via INTRAVENOUS
  Administered 2024-03-14: 100 ug via INTRAVENOUS

## 2024-03-14 MED ORDER — BUPIVACAINE LIPOSOME 1.3 % IJ SUSP: 20.0000 mL | Freq: Once | INTRAMUSCULAR | Status: DC

## 2024-03-14 MED ORDER — ACETAMINOPHEN 500 MG PO TABS
1000.0000 mg | ORAL_TABLET | Freq: Once | ORAL | Status: AC
  Administered 2024-03-14: 1000 mg via ORAL
  Filled 2024-03-14: qty 2

## 2024-03-14 MED ORDER — BUPIVACAINE-EPINEPHRINE (PF) 0.25% -1:200000 IJ SOLN
INTRAMUSCULAR | Status: AC
Start: 2024-03-14 — End: ?
  Filled 2024-03-14: qty 30

## 2024-03-14 MED ORDER — ENOXAPARIN SODIUM 40 MG/0.4ML IJ SOSY
40.0000 mg | PREFILLED_SYRINGE | Freq: Once | INTRAMUSCULAR | Status: AC
  Administered 2024-03-14: 40 mg via SUBCUTANEOUS
  Filled 2024-03-14: qty 0.4

## 2024-03-14 MED ORDER — CHLORHEXIDINE GLUCONATE 0.12 % MT SOLN
15.0000 mL | Freq: Once | OROMUCOSAL | Status: AC
  Administered 2024-03-14: 15 mL via OROMUCOSAL
  Filled 2024-03-14: qty 15

## 2024-03-14 MED ORDER — ACETAMINOPHEN 500 MG PO TABS: 500.0000 mg | ORAL_TABLET | Freq: Four times a day (QID) | ORAL | 3 refills | Status: AC

## 2024-03-14 MED ORDER — ONDANSETRON HCL 4 MG/2ML IJ SOLN
INTRAMUSCULAR | Status: DC | PRN
  Administered 2024-03-14: 4 mg via INTRAVENOUS

## 2024-03-14 MED ORDER — HYDROMORPHONE HCL 1 MG/ML IJ SOLN
INTRAMUSCULAR | Status: AC
  Filled 2024-03-14: qty 1

## 2024-03-14 MED ORDER — ORAL CARE MOUTH RINSE: 15.0000 mL | Freq: Once | OROMUCOSAL | Status: AC

## 2024-03-14 MED ORDER — MIDAZOLAM HCL 2 MG/2ML IJ SOLN
INTRAMUSCULAR | Status: AC
Start: 2024-03-14 — End: ?
  Filled 2024-03-14: qty 2

## 2024-03-14 MED ORDER — PROPOFOL 10 MG/ML IV BOLUS
INTRAVENOUS | Status: AC
  Filled 2024-03-14: qty 20

## 2024-03-14 MED ORDER — BUPIVACAINE HCL 0.25 % IJ SOLN: INTRAMUSCULAR | Status: DC | PRN

## 2024-03-14 SURGICAL SUPPLY — 32 items
BLADE CLIPPER SURG (BLADE) IMPLANT
CANISTER SUCTION 3000ML PPV (SUCTIONS) ×1 IMPLANT
CHLORAPREP W/TINT 26 (MISCELLANEOUS) ×1 IMPLANT
CLIP APPLIE 5 13 M/L LIGAMAX5 (MISCELLANEOUS) ×1 IMPLANT
COVER SURGICAL LIGHT HANDLE (MISCELLANEOUS) ×1 IMPLANT
DERMABOND ADVANCED .7 DNX12 (GAUZE/BANDAGES/DRESSINGS) ×1 IMPLANT
DISSECTOR BLUNT TIP ENDO 5MM (MISCELLANEOUS) IMPLANT
ELECT CAUTERY BLADE 6.4 (BLADE) ×1 IMPLANT
ELECTRODE REM PT RTRN 9FT ADLT (ELECTROSURGICAL) ×1 IMPLANT
GLOVE BIO SURGEON STRL SZ 6.5 (GLOVE) ×1 IMPLANT
GLOVE BIOGEL PI IND STRL 6 (GLOVE) ×1 IMPLANT
GOWN STRL REUS W/ TWL LRG LVL3 (GOWN DISPOSABLE) ×3 IMPLANT
IRRIGATION SUCT STRKRFLW 2 WTP (MISCELLANEOUS) IMPLANT
KIT BASIN OR (CUSTOM PROCEDURE TRAY) ×1 IMPLANT
KIT IMAGING PINPOINTPAQ (MISCELLANEOUS) IMPLANT
KIT TURNOVER KIT B (KITS) ×1 IMPLANT
NS IRRIG 1000ML POUR BTL (IV SOLUTION) ×1 IMPLANT
PAD ARMBOARD POSITIONER FOAM (MISCELLANEOUS) ×1 IMPLANT
PENCIL BUTTON HOLSTER BLD 10FT (ELECTRODE) ×1 IMPLANT
POUCH RETRIEVAL ECOSAC 10 (ENDOMECHANICALS) ×1 IMPLANT
SCISSORS LAP 5X35 DISP (ENDOMECHANICALS) ×1 IMPLANT
SET TUBE SMOKE EVAC HIGH FLOW (TUBING) ×1 IMPLANT
SLEEVE Z-THREAD 5X100MM (TROCAR) ×2 IMPLANT
SUT MNCRL AB 4-0 PS2 18 (SUTURE) ×1 IMPLANT
SUT VIC AB 0 UR5 27 (SUTURE) IMPLANT
SUT VICRYL 0 AB UR-6 (SUTURE) IMPLANT
TOWEL GREEN STERILE FF (TOWEL DISPOSABLE) ×1 IMPLANT
TRAY LAPAROSCOPIC MC (CUSTOM PROCEDURE TRAY) ×1 IMPLANT
TROCAR BALLN 12MMX100 BLUNT (TROCAR) ×1 IMPLANT
TROCAR Z-THREAD OPTICAL 5X100M (TROCAR) ×1 IMPLANT
WARMER LAPAROSCOPE (MISCELLANEOUS) ×1 IMPLANT
WATER STERILE IRR 1000ML POUR (IV SOLUTION) ×1 IMPLANT

## 2024-03-14 NOTE — Transfer of Care (Signed)
 Immediate Anesthesia Transfer of Care Note  Patient: Julie [name]  Procedure(s) Performed: LAPAROSCOPIC CHOLECYSTECTOMY (Abdomen)  Patient Location: PACU  Anesthesia Type:General  Level of Consciousness: awake, alert , and oriented  Airway & Oxygen Therapy: Patient Spontanous Breathing  Post-op Assessment: Report given to RN  Post vital signs: Reviewed and stable  Last Vitals:  Vitals Value Taken Time  BP 127/65 03/14/24 1405  Temp 36.5 C 03/14/24 1405  Pulse 101 03/14/24 1407  Resp 36 03/14/24 1407  SpO2 95 % 03/14/24 1407  Vitals shown include unfiled device data.  Last Pain:  Vitals:   03/14/24 1129  TempSrc:   PainSc: 3       Patients Stated Pain Goal: 0 (03/14/24 1121)  Complications: No notable events documented.

## 2024-03-14 NOTE — Op Note (Signed)
   Operative Note  Date: 03/14/2024  Procedure: laparoscopic cholecystectomy  Pre-op diagnosis: biliary colic Post-op diagnosis: same  Indication and clinical history: The patient is a 38 y.o. year old female with biliary colic  Surgeon: Anda Bamberg, MD  Anesthesiologist: Allean [place], MD Anesthesia: General  Findings:  Specimen: gallbladder EBL: <5cc Drains/Implants: none  Disposition: PACU - hemodynamically stable.  Description of procedure: The patient was positioned supine on the operating room table. Time-out was performed verifying correct patient, procedure, signature of informed consent, and administration of pre-operative antibiotics. General anesthetic induction and intubation were uneventful. The abdomen was prepped and draped in the usual sterile fashion. An infra-umbilical incision was made using an open technique using zero vicryl stay sutures on either side of the fascia and a 10mm Hassan port inserted. After establishing pneumoperitoneum, which the patient tolerated well, the abdominal cavity was inspected and no injury of any intra-abdominal structures was identified. Additional ports were placed under direct visualization and using local anesthetic: two 5mm ports in the right subcostal region and a 5mm port in the epigastric region. The patient was re-positioned to reverse Trendelenburg and right side up. Adhesiolysis was performed to expose the gallbladder, which was then retracted cephalad. The infundibulum was identified and retracted toward the right lower quadrant. The peritoneum was incised over the infundibulum and the triangle of Calot dissected to expose the critical view of safety. With clear identification and isolation of the cystic duct and cystic artery, the cystic artery was doubly clipped and divided. After this, the cystic duct was identified as a single structure entering the gallbladder, and was also doubly clipped and divided. The gallbladder was dissected off  the liver bed using electrocautery and hemostasis of the liver bed was confirmed prior to separation of the final peritoneal attachments of the gallbladder to the liver bed. After transection of the final peritoneal attachments, the gallbladder was placed in an endoscopic specimen retrieval bag, removed via the umbilical port site, and sent to pathology as a permanent specimen. The gallbladder fossa was inspected confirming hemostasis, the absence of bile leakage from the cystic duct stump, and correct placement of clips on the cystic artery and cystic duct stumps. The abdomen was desufflated and the fascia of the umbilical port site was closed using the previously placed stay sutures. Additional local anesthetic was administered at the umbilical port site.  The skin of all incisions was closed with 4-0 monocryl. Sterile dressings were applied. All sponge and instrument counts were correct at the conclusion of the procedure. The patient was awakened from anesthesia, extubated uneventfully, and transported to the PACU - hemodynamically stable.. There were no complications.      Anda Bamberg, MD General and Trauma Surgery [facility]

## 2024-03-14 NOTE — H&P (Signed)
    Julie [name] is an 38 y.o. female.   HPI: 11F with biliary dyskinesia. Plan lap chole. The patient has had no hospitalizations, doctors visits, ER visits, surgeries, or newly diagnosed allergies since being seen in the office.    Past Medical History:  Diagnosis Date   Anxiety    Hx of chlamydia infection    Hx of varicella    Hypertension    Seasonal allergies    Vaginal Pap smear, abnormal     Past Surgical History:  Procedure Laterality Date   cleft lip surgery     COLPOSCOPY     FOOT SURGERY      Family History  Problem Relation Age of Onset   Diabetes Father    Hypertension Father    Diabetes Maternal Grandmother    Hypertension Maternal Grandmother     Social History:  reports that she has never smoked. She has never used smokeless tobacco. She reports current alcohol use. She reports that she does not use drugs.  Allergies: No Known Allergies  Medications: I have reviewed the patient's current medications.  Results for orders placed or performed during the hospital encounter of 03/14/24 (from the past 48 hours)  Pregnancy, urine POC     Status: None   Collection Time: 03/14/24 11:16 AM  Result Value Ref Range   Preg Test, Ur NEGATIVE NEGATIVE    Comment:        THE SENSITIVITY OF THIS METHODOLOGY IS >24 mIU/mL     No results found.  ROS 10 point review of systems is negative except as listed above in HPI.   Physical Exam Blood pressure 119/89, pulse 87, temperature 98.3 F (36.8 C), temperature source Oral, resp. rate 18, height 5\' 5"  (1.651 m), weight 120.2 kg, last menstrual period 03/07/2024, SpO2 98%. Constitutional: well-developed, well-nourished HEENT: pupils equal, round, reactive to light, 2mm b/l, moist conjunctiva, external inspection of ears and nose normal, hearing intact Oropharynx: normal oropharyngeal mucosa, normal dentition Neck: no thyromegaly, trachea midline, no midline cervical tenderness to palpation Chest: breath sounds  equal bilaterally, normal respiratory effort, no midline or lateral chest wall tenderness to palpation/deformity Abdomen: soft, NT, no bruising, no hepatosplenomegaly GU: normal female genitalia  Back: no wounds, no thoracic/lumbar spine tenderness to palpation, no thoracic/lumbar spine stepoffs Rectal: deferred Extremities: 2+ radial and pedal pulses bilaterally, intact motor and sensation bilateral UE and LE, no peripheral edema MSK: normal gait/station, no clubbing/cyanosis of fingers/toes, normal ROM of all four extremities Skin: warm, dry, no rashes Psych: normal memory, normal mood/affect     Assessment/Plan: Biliary dyskinesia - plan lap chole. Informed consent was obtained after detailed explanation of risks, including bleeding, infection, biloma, hematoma, injury to common bile duct, need for IOC to delineate anatomy, and need for conversion to open procedure. All questions answered to the patient's satisfaction. FEN - strict NPO DVT - SCDs   Dispo - home post-op    Anda Bamberg, MD General and Trauma Surgery [facility]

## 2024-03-14 NOTE — Discharge Instructions (Signed)
 [facility]  LAPAROSCOPIC SURGERY: POST OP INSTRUCTIONS Always review your discharge instruction sheet given to you by the facility where your surgery was performed. IF YOU HAVE DISABILITY OR FAMILY LEAVE FORMS, YOU MUST BRING THEM TO THE OFFICE FOR PROCESSING.   DO NOT GIVE THEM TO YOUR DOCTOR.  PAIN CONTROL  Pain regimen: take over-the-counter tylenol  (acetaminophen ) 1000mg  every six hours, the prescription ibuprofen  (600mg ) every six hours and the robaxin (methocarbamol) 750mg  every six hours. With all three of these, you should be taking something every two hours. Example: tylenol  (acetaminophen ) at 8am, ibuprofen  at 10am, robaxin (methocarbamol) at 12pm, tylenol  (acetaminophen ) again at 2pm, ibuprofen  again at 4pm, robaxin (methocarbamol) at 6pm. You also have a prescription for oxycodone , which should be taken if the tylenol  (acetaminophen ), ibuprofen , and robaxin (methocarbamol) are not enough to control your pain. You may take the oxycodone  as frequently as every four hours as needed, but if you are taking the other medications as above, you should not need the oxycodone  this frequently. You have also been given a prescription for colace (docusate) which is a stool softener. Please take this as prescribed because the oxycodone  can cause constipation and the colace (docusate) will minimize or prevent constipation. Do not drive while taking or under the influence of the oxycodone  as it is a narcotic medication. Use ice packs to help control pain. If you need a refill on your pain medication, please contact your pharmacy.  They will contact our office to request authorization. Prescriptions will not be filled after 5pm or on week-ends.  HOME MEDICATIONS Take your usually prescribed medications unless otherwise directed.  DIET You should follow a light diet the first few days after arrival home.  Be sure to include lots of fluids daily.  Do not consume alcohol while  taking oxycodone  or ibuprofen .   CONSTIPATION It is common to experience some constipation after surgery and if you are taking pain medication.  Increasing fluid intake and taking a stool softener (such as Colace) will usually help or prevent this problem from occurring.  A mild laxative (Miralax, over-the counter) should be taken according to package instructions if there are no bowel movements after 48 hours. If still no bowel movement 24 hours after taking Miralax, you may try magnesium citrate, available over the counter at a local pharmacy.   WOUND/INCISION CARE Most patients will experience some swelling and bruising in the area of the incisions.  Ice packs will help.  Swelling and bruising can take several days to resolve.  May shower beginning 03/15/2024.  Do not peel off or scrub skin glue. May allow warm soapy water to run over incision, then rinse and pat dry.  Do not soak in any water (tubs, hot tubs, pools, lakes, oceans) for one week.   ACTIVITIES You may resume regular (light) daily activities beginning the next day--such as daily self-care, walking, climbing stairs--gradually increasing activities as tolerated.  You may have sexual intercourse when it is comfortable.   No lifting greater than 5 pounds for six weeks.  You may drive when you are no longer taking narcotic pain medication, you can comfortably wear a seatbelt, and you can safely maneuver your car and apply brakes.  FOLLOW-UP You should see your doctor in the office for a follow-up appointment approximately 2-3 weeks after your surgery.  You should have been given your post-op/follow-up appointment when your surgery was scheduled.  If you did not receive a post-op/follow-up appointment, make sure that you  call for this appointment within a day or two after you arrive home to ensure a convenient appointment time.  WHEN TO CALL YOUR DOCTOR: Fever over 101.5 Inability to urinate Continued bleeding from  incision. Increased pain, redness, or drainage from the incision. Increasing abdominal pain  The clinic staff is available to answer your questions during regular business hours.  Please don't hesitate to call and ask to speak to one of the nurses for clinical concerns.  If you have a medical emergency, go to the nearest emergency room or call 911.  A surgeon from [facility] is always on call at the hospital. [address], [address], [place], [place]  [address] ? [address] [address], [place], [place]   [address] [phone] ? [phone] ? FAX [phone] Web site: [email or web address]

## 2024-03-14 NOTE — Anesthesia Procedure Notes (Addendum)
 Procedure Name: Intubation Date/Time: 03/14/2024 1:05 PM  Performed by: Hershall Lory, CRNAPre-anesthesia Checklist: Patient identified, Emergency Drugs available, Suction available and Patient being monitored Patient Re-evaluated:Patient Re-evaluated prior to induction Oxygen Delivery Method: Circle System Utilized Preoxygenation: Pre-oxygenation with 100% oxygen Induction Type: IV induction Ventilation: Mask ventilation without difficulty Laryngoscope Size: Miller and 2 Grade View: Grade II Tube type: Oral Tube size: 7.0 mm Number of attempts: 1 Airway Equipment and Method: Stylet and Oral airway Placement Confirmation: ETT inserted through vocal cords under direct vision, positive ETCO2 and breath sounds checked- equal and bilateral Secured at: 21 cm Tube secured with: Tape Dental Injury: Teeth and Oropharynx as per pre-operative assessment  Comments: By Calvin Caulk

## 2024-03-15 ENCOUNTER — Encounter (HOSPITAL_COMMUNITY): Payer: Self-pay | Admitting: Surgery

## 2024-03-15 LAB — SURGICAL PATHOLOGY

## 2024-03-15 NOTE — Anesthesia Postprocedure Evaluation (Signed)
 Anesthesia Post Note  Patient: Julie [name]  Procedure(s) Performed: LAPAROSCOPIC CHOLECYSTECTOMY (Abdomen)     Patient location during evaluation: PACU Anesthesia Type: General Level of consciousness: awake and alert Pain management: pain level controlled Vital Signs Assessment: post-procedure vital signs reviewed and stable Respiratory status: spontaneous breathing, nonlabored ventilation, respiratory function stable and patient connected to nasal cannula oxygen Cardiovascular status: blood pressure returned to baseline and stable Postop Assessment: no apparent nausea or vomiting Anesthetic complications: no   No notable events documented.  Last Vitals:  Vitals:   03/14/24 1500 03/14/24 1515  BP: 115/71 109/74  Pulse: (!) 101 (!) 101  Resp: (!) 25 16  Temp:  36.7 C  SpO2: 94% 97%    Last Pain:  Vitals:   03/14/24 1405  TempSrc:   PainSc: Asleep                 Leslye Rast
